# Patient Record
Sex: Female | Born: 1960 | Race: White | Hispanic: No | Smoking: Current every day smoker
Health system: Southern US, Community
[De-identification: ages and names within clinical notes are randomized; demographics above are authoritative.]

## PROBLEM LIST (undated history)

## (undated) DIAGNOSIS — Z8619 Personal history of other infectious and parasitic diseases: Secondary | ICD-10-CM

## (undated) HISTORY — DX: Personal history of other infectious and parasitic diseases: Z86.19

---

## 2008-07-03 DIAGNOSIS — Z720 Tobacco use: Secondary | ICD-10-CM | POA: Insufficient documentation

## 2008-07-03 DIAGNOSIS — Z8249 Family history of ischemic heart disease and other diseases of the circulatory system: Secondary | ICD-10-CM | POA: Insufficient documentation

## 2010-07-22 DIAGNOSIS — Z205 Contact with and (suspected) exposure to viral hepatitis: Secondary | ICD-10-CM | POA: Insufficient documentation

## 2010-07-23 ENCOUNTER — Ambulatory Visit: Payer: Self-pay | Admitting: Family Medicine

## 2010-08-01 ENCOUNTER — Ambulatory Visit: Payer: Self-pay | Admitting: Family Medicine

## 2010-08-18 ENCOUNTER — Ambulatory Visit: Payer: Self-pay | Admitting: Family Medicine

## 2010-08-21 HISTORY — PX: DILATION AND CURETTAGE OF UTERUS: SHX78

## 2010-09-11 ENCOUNTER — Ambulatory Visit: Payer: Self-pay | Admitting: Obstetrics and Gynecology

## 2010-09-16 ENCOUNTER — Ambulatory Visit: Payer: Self-pay | Admitting: Obstetrics and Gynecology

## 2010-09-19 LAB — PATHOLOGY REPORT

## 2011-05-04 ENCOUNTER — Ambulatory Visit: Payer: Self-pay | Admitting: Family Medicine

## 2011-08-25 ENCOUNTER — Emergency Department: Payer: Self-pay | Admitting: Internal Medicine

## 2011-08-25 HISTORY — PX: OTHER SURGICAL HISTORY: SHX169

## 2011-09-18 ENCOUNTER — Emergency Department: Payer: Self-pay | Admitting: Emergency Medicine

## 2011-09-18 ENCOUNTER — Ambulatory Visit: Payer: Self-pay | Admitting: Family Medicine

## 2011-10-28 ENCOUNTER — Ambulatory Visit: Payer: Self-pay | Admitting: Obstetrics and Gynecology

## 2011-10-28 LAB — BASIC METABOLIC PANEL
Calcium, Total: 8.8 mg/dL (ref 8.5–10.1)
Chloride: 107 mmol/L (ref 98–107)
Co2: 28 mmol/L (ref 21–32)
Osmolality: 278 (ref 275–301)
Potassium: 4.2 mmol/L (ref 3.5–5.1)
Sodium: 140 mmol/L (ref 136–145)

## 2011-10-28 LAB — WBC: WBC: 5.6 10*3/uL (ref 3.6–11.0)

## 2011-10-28 LAB — PREGNANCY, URINE: Pregnancy Test, Urine: NEGATIVE m[IU]/mL

## 2011-11-06 ENCOUNTER — Ambulatory Visit: Payer: Self-pay | Admitting: Obstetrics and Gynecology

## 2011-11-06 HISTORY — PX: TOTAL LAPAROSCOPIC HYSTERECTOMY WITH SALPINGECTOMY: SHX6742

## 2011-11-06 LAB — HEMOGLOBIN: HGB: 13.2 g/dL (ref 12.0–16.0)

## 2011-11-06 LAB — PREGNANCY, URINE: Pregnancy Test, Urine: NEGATIVE m[IU]/mL

## 2011-11-07 LAB — BASIC METABOLIC PANEL
BUN: 5 mg/dL — ABNORMAL LOW (ref 7–18)
Calcium, Total: 7.9 mg/dL — ABNORMAL LOW (ref 8.5–10.1)
Chloride: 106 mmol/L (ref 98–107)
Creatinine: 0.69 mg/dL (ref 0.60–1.30)
EGFR (African American): >60
EGFR (Non-African Amer.): >60
Osmolality: 280 (ref 275–301)
Sodium: 142 mmol/L (ref 136–145)

## 2012-08-12 ENCOUNTER — Ambulatory Visit: Payer: Self-pay | Admitting: Gastroenterology

## 2012-08-12 LAB — HM COLONOSCOPY: HM Colonoscopy: NORMAL

## 2012-08-23 ENCOUNTER — Ambulatory Visit: Payer: Self-pay | Admitting: Family Medicine

## 2012-08-24 ENCOUNTER — Ambulatory Visit: Payer: Self-pay | Admitting: Family Medicine

## 2012-08-31 ENCOUNTER — Ambulatory Visit: Payer: Self-pay | Admitting: Family Medicine

## 2012-08-31 HISTORY — PX: OTHER SURGICAL HISTORY: SHX169

## 2012-12-17 ENCOUNTER — Ambulatory Visit: Payer: Self-pay | Admitting: Family Medicine

## 2012-12-17 HISTORY — PX: OTHER SURGICAL HISTORY: SHX169

## 2014-02-01 LAB — LIPID PANEL
Cholesterol: 177 mg/dL (ref 0–200)
HDL: 60 mg/dL (ref 35–70)
LDL Cholesterol: 106 mg/dL
TRIGLYCERIDES: 57 mg/dL (ref 40–160)

## 2014-07-24 LAB — HEPATIC FUNCTION PANEL
ALT: 14 U/L (ref 7–35)
AST: 18 U/L (ref 13–35)

## 2014-07-24 LAB — BASIC METABOLIC PANEL
BUN: 10 mg/dL (ref 4–21)
CREATININE: 0.8 mg/dL (ref 0.5–1.1)
Glucose: 88 mg/dL
POTASSIUM: 4.4 mmol/L (ref 3.4–5.3)
SODIUM: 142 mmol/L (ref 137–147)

## 2014-07-24 LAB — CBC AND DIFFERENTIAL
HCT: 44 % (ref 36–46)
HEMOGLOBIN: 15.5 g/dL (ref 12.0–16.0)
Platelets: 229 10*3/uL (ref 150–399)
WBC: 6.4 10^3/mL

## 2014-07-24 LAB — TSH: TSH: 1.68 u[IU]/mL (ref 0.41–5.90)

## 2015-01-13 NOTE — Op Note (Signed)
PATIENT NAME:  Kristen Wright, Kristen Wright MR#:  [id number] DATE OF BIRTH:  1961/07/02  DATE OF PROCEDURE:  11/06/2011  PREOPERATIVE DIAGNOSES:  1. Severe midline pelvic pain unresponsive to medical management.  2. Severe dysmenorrhea.   POSTOPERATIVE DIAGNOSES:  1. Severe midline pelvic pain unresponsive to medical management.  2. Severe dysmenorrhea.   PROCEDURE: Total laparoscopic hysterectomy, bilateral salpingectomy for uterus greater than 250 mg.   SURGEON: Elliot Gurneyarrie C. Madolin Twaddle, MD  ASSISTANT: Deloris PingPhilip J. Rosenow, M.D.   ESTIMATED BLOOD LOSS: Approximately 200 mL.   DESCRIPTION OF PROCEDURE: Patient was taken to Operating Room, placed in supine position. After adequate general endotracheal anesthesia was instilled, the patient was prepped and draped in the usual sterile fashion. Side-opening speculum was placed in the patient's vagina and the anterior lip of the cervix was grasped with Wright single-tooth tenaculum. The Hulka tenaculum was placed into the uterus and Foley catheter was placed and side-opening speculum was removed. The umbilicus was injected with Marcaine. Incision was made. Veress needle was placed. Hanging drop test, fluid instillation test, fluid aspiration test showed proper placement of the Veress needle. CO2 was then turned on and when tympany was heard around the liver CO2 was placed from low flow to high flow. The Veress needle was removed and the Xcel trocar was placed under direct visualization. Patient was placed in Trendelenburg. Very globular uterus was identified. Tubes were grasped and removed with the Ace Harmonic. Cautery was then used to come across the round ligament, the utero-ovarian ligament and Ace Harmonic was used to separate the uterus, tubes and round ligament from the sidewall. Serial bites taken down the sides of the uterus. Bladder flap was created. V-care had been placed in the vagina. Uterine arteries are identified bilaterally. These are cauterized with the  Kleppinger and cut with the Ace Harmonic. The Ace Harmonic was then used to cut across the V-care green cup. When the cervix was released the uterus was pulled through the vagina to the outside. Wright vaginal plug was placed and the abdomen was allowed to expand with CO2. The Endo Stitch was then used to have running locked Vicryl sutures across the vaginal cuff. There were Wright few gaps and the needle of the Endo Stitch popped off. The needle was found and cut off and removed from the abdomen. The V-lock was then used to come across the vagina and tie off the knots the old Vicryl suture. These were then cut at the base and the sutures were removed from the vagina. The pelvis was then irrigated with copious amounts of warm normal saline. Intercede was placed across the vaginal cuff. Bowels were allowed to fall back into the pelvis. CO2 was allowed to escape. UR-6 was used under direct visualization to place Wright suture under the umbilicus as well as the left lower quadrant. 4-0 Monocryl was then used to approximate the skin edges. Dermabond was placed. The vaginal packing was taken out of the patient's vagina. Clear urine was noted with blue dye as indigo carmine had been given to help identify the ureters and to be sure there was no entry into the bladder.   ____________________________ Elliot Gurneyarrie C. Flynn Lininger, MD cck:cms D: 11/08/2011 00:09:53 ET T: 11/08/2011 08:55:43 ET JOB#: [id number]  cc: Elliot Gurneyarrie C. Braedan Meuth, MD, <Dictator>  Elliot GurneyARRIE C Avyonna Wagoner MD ELECTRONICALLY SIGNED 11/22/2011 1:28

## 2015-03-26 ENCOUNTER — Other Ambulatory Visit: Payer: Self-pay | Admitting: Family Medicine

## 2015-03-26 DIAGNOSIS — G4700 Insomnia, unspecified: Secondary | ICD-10-CM

## 2015-03-26 NOTE — Telephone Encounter (Signed)
Ok to call in rx.  Thanks.  

## 2015-03-26 NOTE — Telephone Encounter (Signed)
Pharmacy request refill. [facility] [place]) Last office visit was 11/20/2014.

## 2015-03-27 NOTE — Telephone Encounter (Signed)
Prescription called in to pharmacy

## 2015-05-24 ENCOUNTER — Other Ambulatory Visit: Payer: Self-pay | Admitting: Family Medicine

## 2015-05-24 DIAGNOSIS — G4700 Insomnia, unspecified: Secondary | ICD-10-CM

## 2015-05-28 DIAGNOSIS — G4700 Insomnia, unspecified: Secondary | ICD-10-CM | POA: Insufficient documentation

## 2015-05-28 NOTE — Telephone Encounter (Signed)
Rx called in to pharmacy. 

## 2015-05-28 NOTE — Telephone Encounter (Signed)
Please call in clonazepam  

## 2015-05-28 NOTE — Telephone Encounter (Signed)
Last OV 11/2014   

## 2015-06-26 ENCOUNTER — Other Ambulatory Visit: Payer: Self-pay | Admitting: Family Medicine

## 2015-06-26 NOTE — Telephone Encounter (Signed)
Please call in alprazolam.  Also, patient is due for office visit and needs to schedule before we can approve addition refills.

## 2015-06-26 NOTE — Telephone Encounter (Signed)
Rx called into pharmacy. Patient notified that she is due for ov appt.

## 2015-07-03 ENCOUNTER — Ambulatory Visit (INDEPENDENT_AMBULATORY_CARE_PROVIDER_SITE_OTHER): Payer: Federal, State, Local not specified - PPO | Admitting: Family Medicine

## 2015-07-03 ENCOUNTER — Encounter: Payer: Self-pay | Admitting: Family Medicine

## 2015-07-03 VITALS — BP 116/78 | HR 60 | Temp 97.7°F | Resp 16 | Wt 129.0 lb

## 2015-07-03 DIAGNOSIS — G4700 Insomnia, unspecified: Secondary | ICD-10-CM | POA: Insufficient documentation

## 2015-07-03 DIAGNOSIS — F439 Reaction to severe stress, unspecified: Secondary | ICD-10-CM | POA: Insufficient documentation

## 2015-07-03 DIAGNOSIS — D509 Iron deficiency anemia, unspecified: Secondary | ICD-10-CM | POA: Insufficient documentation

## 2015-07-03 DIAGNOSIS — M5126 Other intervertebral disc displacement, lumbar region: Secondary | ICD-10-CM | POA: Insufficient documentation

## 2015-07-03 DIAGNOSIS — M7550 Bursitis of unspecified shoulder: Secondary | ICD-10-CM | POA: Insufficient documentation

## 2015-07-03 DIAGNOSIS — F419 Anxiety disorder, unspecified: Secondary | ICD-10-CM | POA: Diagnosis not present

## 2015-07-03 DIAGNOSIS — Z23 Encounter for immunization: Secondary | ICD-10-CM | POA: Diagnosis not present

## 2015-07-03 DIAGNOSIS — N920 Excessive and frequent menstruation with regular cycle: Secondary | ICD-10-CM | POA: Insufficient documentation

## 2015-07-03 DIAGNOSIS — R9389 Abnormal findings on diagnostic imaging of other specified body structures: Secondary | ICD-10-CM | POA: Insufficient documentation

## 2015-07-03 DIAGNOSIS — N281 Cyst of kidney, acquired: Secondary | ICD-10-CM | POA: Insufficient documentation

## 2015-07-03 DIAGNOSIS — G2581 Restless legs syndrome: Secondary | ICD-10-CM | POA: Insufficient documentation

## 2015-07-03 DIAGNOSIS — E041 Nontoxic single thyroid nodule: Secondary | ICD-10-CM | POA: Insufficient documentation

## 2015-07-03 DIAGNOSIS — E7800 Pure hypercholesterolemia, unspecified: Secondary | ICD-10-CM | POA: Diagnosis not present

## 2015-07-03 DIAGNOSIS — F32A Depression, unspecified: Secondary | ICD-10-CM

## 2015-07-03 DIAGNOSIS — F329 Major depressive disorder, single episode, unspecified: Secondary | ICD-10-CM

## 2015-07-03 MED ORDER — BUPROPION HCL ER (SR) 100 MG PO TB12: 100.0000 mg | ORAL_TABLET | Freq: Two times a day (BID) | ORAL | Status: DC

## 2015-07-03 NOTE — Progress Notes (Signed)
Patient: Kristen HouseholderBecky A Wright Female    DOB: 1960/12/06   54 y.o.   MRN: [id number] Visit Date: 07/03/2015  Today's Provider: Mila Merryonald Jeanie Mccard, MD   Chief Complaint  Patient presents with  . Medication Refill  . Anxiety   Subjective:    HPI  Insomnia Started clonazepam 0.5 mg 1 tablet in the evening at last visit in March, and continues on trazodone 150 Qhs. She states she is sleeping much better since starting clonazepam. She was also having RLS symptoms at that time which seem to have resolve on clonazepam  Follow up cholesterol She states she is taking lovastatin consistently and is tolerating well.  Lab Results  Component Value Date   CHOL 177 02/01/2014   Lab Results  Component Value Date   HDL 60 02/01/2014   Lab Results  Component Value Date   LDLCALC 106 02/01/2014   Lab Results  Component Value Date   TRIG 57 02/01/2014   No results found for: CHOLHDL No results found for: LDLDIRECT  Depression She states she stills stays depressed, but is better when she is busy. She has been able to talk with friends and some resources through work which has been helpful. However, she has very poor motivation when at home by herself and never feels like doing anything. She is interested in making some changes to her antidepressants. She is tolerating escitalopram well, but not sure how much it is really helping.   No Known Allergies Previous Medications   ALPRAZOLAM (XANAX) 0.5 MG TABLET    take 1 tablet by mouth every 6 hours   CLONAZEPAM (KLONOPIN) 0.5 MG TABLET    take 1 tablet by mouth at bedtime   ESCITALOPRAM (LEXAPRO) 20 MG TABLET    Take 1 tablet by mouth daily.   LOVASTATIN (MEVACOR) 20 MG TABLET    Take 1 tablet by mouth daily.   TRAZODONE (DESYREL) 150 MG TABLET    Take 1 tablet by mouth every evening.    Review of Systems  Respiratory: Negative for chest tightness and shortness of breath.   Cardiovascular: Negative for chest pain and palpitations.    Neurological: Positive for dizziness and light-headedness. Negative for headaches.  Psychiatric/Behavioral: Positive for sleep disturbance.    Social History  Substance Use Topics  . Smoking status: Current Every Day Smoker -- 1.00 packs/day for 30 years    Types: Cigarettes  . Smokeless tobacco: Not on file  . Alcohol Use: No   Objective:   BP 116/78 mmHg  Pulse 60  Temp(Src) 97.7 F (36.5 C) (Oral)  Resp 16  Wt 129 lb (58.514 kg)  Physical Exam General appearance: alert, well developed, well nourished, cooperative and in no distress Head: Normocephalic, without obvious abnormality, atraumatic Lungs: Respirations even and unlabored Extremities: No gross deformities Skin: Skin color, texture, turgor normal. No rashes seen  Psych: Appropriate mood and affect. Neurologic: Mental status: Alert, oriented to person, place, and time, thought content appropriate.      Assessment & Plan:     1. Need for prophylactic vaccination and inoculation against influenza  - Flu Vaccine QUAD 36+ mos IM  2. Pure hypercholesterolemia She is tolerating lovastatin well with no adverse effects.   - TSH - Lipid panel  3. Anxiety Doing fairly well.   4. Insomnia Much better with addition of clonazepam  5. Depression Not much better. Continue escitalopram and buproprion. Call if any adverse effects.  - buPROPion (WELLBUTRIN SR) 100 MG 12  hr tablet; Take 1 tablet (100 mg total) by mouth 2 (two) times daily. Once daily for 7 days, then one tablet twice daily.  Dispense: 60 tablet; Refill: 1  Follow up about 6 weeks       Mila Merry, MD  [facility]

## 2015-07-04 ENCOUNTER — Encounter: Payer: Self-pay | Admitting: Family Medicine

## 2015-07-27 LAB — LIPID PANEL
CHOLESTEROL TOTAL: 164 mg/dL (ref 100–199)
Chol/HDL Ratio: 2.7 ratio units (ref 0.0–4.4)
HDL: 61 mg/dL (ref >39)
LDL Calculated: 90 mg/dL (ref 0–99)
Triglycerides: 65 mg/dL (ref 0–149)
VLDL CHOLESTEROL CAL: 13 mg/dL (ref 5–40)

## 2015-07-27 LAB — TSH: TSH: 0.698 u[IU]/mL (ref 0.450–4.500)

## 2015-08-14 ENCOUNTER — Ambulatory Visit (INDEPENDENT_AMBULATORY_CARE_PROVIDER_SITE_OTHER): Payer: Federal, State, Local not specified - PPO | Admitting: Family Medicine

## 2015-08-14 ENCOUNTER — Encounter: Payer: Self-pay | Admitting: Family Medicine

## 2015-08-14 VITALS — BP 110/74 | HR 57 | Temp 97.4°F | Resp 16 | Wt 132.4 lb

## 2015-08-14 DIAGNOSIS — F419 Anxiety disorder, unspecified: Secondary | ICD-10-CM | POA: Diagnosis not present

## 2015-08-14 DIAGNOSIS — F32A Depression, unspecified: Secondary | ICD-10-CM

## 2015-08-14 DIAGNOSIS — F329 Major depressive disorder, single episode, unspecified: Secondary | ICD-10-CM

## 2015-08-14 MED ORDER — ALPRAZOLAM 0.5 MG PO TABS: 0.5000 mg | ORAL_TABLET | Freq: Four times a day (QID) | ORAL | Status: DC | PRN

## 2015-08-14 MED ORDER — BUPROPION HCL ER (SR) 100 MG PO TB12: 100.0000 mg | ORAL_TABLET | Freq: Two times a day (BID) | ORAL | Status: DC

## 2015-08-14 NOTE — Progress Notes (Signed)
Subjective:     Patient ID: Kristen HouseholderBecky A Wolfinger, female   DOB: 06-Jul-1961, 54 y.o.   MRN: [id number]  HPI  Depression, Follow-up  She  was last seen for this 1 months ago. Changes made at last visit include adding bupropion 100mg  twice a day.   She reports good compliance with treatment. She is not having side effects.   She reports good tolerance of treatment.  She feels she is Improved since last visit. However the holidays are usually harder on her.   ------------------------------------------------------------------------     Review of Systems  Constitutional: Negative for fever, chills, diaphoresis and unexpected weight change.  HENT: Negative.   Respiratory: Negative.   Cardiovascular: Negative.   Endocrine: Negative.   Genitourinary: Negative.   Hematological: Negative.        Objective:   Physical Exam  Blood pressure 110/74, pulse 57, temperature 97.4 F (36.3 C), temperature source Oral, resp. rate 16, weight 132 lb 6.4 oz (60.056 kg), SpO2 95 %. General appearance: alert, well developed, well nourished, cooperative and in no distress Head: Normocephalic, without obvious abnormality, atraumatic Lungs: Respirations even and unlabored Extremities: No gross deformities Skin: Skin color, texture, turgor normal. No rashes seen  Psych: Appropriate mood and affect. Neurologic: Mental status: Alert, oriented to person, place, and time, thought content appropriate.      Assessment/Plan     1. Anxiety   2. Depression A bit improved since adding bupropion. Gave her option of continue current medications or increase bupropion dose. She thinks she is doing fine and will do better once we get through the holidays. Advised if to call if she feels she wants higher dose.

## 2015-08-21 ENCOUNTER — Other Ambulatory Visit: Payer: Self-pay | Admitting: Family Medicine

## 2015-09-13 ENCOUNTER — Other Ambulatory Visit: Payer: Self-pay | Admitting: Family Medicine

## 2015-09-23 ENCOUNTER — Other Ambulatory Visit: Payer: Self-pay | Admitting: Family Medicine

## 2015-09-24 NOTE — Telephone Encounter (Signed)
Please call in clonazepam  

## 2015-09-24 NOTE — Telephone Encounter (Signed)
Rx called in to pharmacy. 

## 2015-09-25 ENCOUNTER — Ambulatory Visit (INDEPENDENT_AMBULATORY_CARE_PROVIDER_SITE_OTHER): Payer: Federal, State, Local not specified - PPO | Admitting: Physician Assistant

## 2015-09-25 ENCOUNTER — Encounter: Payer: Self-pay | Admitting: Physician Assistant

## 2015-09-25 ENCOUNTER — Ambulatory Visit
Admission: RE | Admit: 2015-09-25 | Discharge: 2015-09-25 | Disposition: A | Discharge status: Home / Self Care | Payer: Federal, State, Local not specified - PPO | Source: Ambulatory Visit | Attending: Physician Assistant | Admitting: Physician Assistant

## 2015-09-25 VITALS — BP 100/70 | HR 68 | Temp 98.1°F | Resp 16 | Wt 126.0 lb

## 2015-09-25 DIAGNOSIS — M7989 Other specified soft tissue disorders: Secondary | ICD-10-CM | POA: Diagnosis not present

## 2015-09-25 DIAGNOSIS — M79641 Pain in right hand: Secondary | ICD-10-CM

## 2015-09-25 DIAGNOSIS — F32A Depression, unspecified: Secondary | ICD-10-CM

## 2015-09-25 DIAGNOSIS — F329 Major depressive disorder, single episode, unspecified: Secondary | ICD-10-CM | POA: Diagnosis not present

## 2015-09-25 DIAGNOSIS — R102 Pelvic and perineal pain: Secondary | ICD-10-CM

## 2015-09-25 LAB — POCT URINALYSIS DIPSTICK
Bilirubin, UA: NEGATIVE
GLUCOSE UA: NEGATIVE
Ketones, UA: NEGATIVE
LEUKOCYTES UA: NEGATIVE
NITRITE UA: NEGATIVE
Protein, UA: NEGATIVE
Spec Grav, UA: <=1.005
UROBILINOGEN UA: 0.2
pH, UA: 6

## 2015-09-25 MED ORDER — BUPROPION HCL ER (SR) 150 MG PO TB12: 150.0000 mg | ORAL_TABLET | Freq: Two times a day (BID) | ORAL | Status: DC

## 2015-09-25 NOTE — Patient Instructions (Signed)
Complicated Grieving Grief is a normal response to the death of someone close to you. Feelings of fear, anger, and guilt can affect almost everyone who loses a loved one. It is also common to have symptoms of depression while you are grieving. These include problems with sleep, loss of appetite, and lack of energy. They may last for weeks or months after a loss. Complicated grief is different from normal grief or depression. Normal grieving involves sadness and feelings of loss, but these feelings are not constant. Complicated grief is a constant and severe type of grief. It interferes with your ability to function normally. It may last for several months to a year or longer. Complicated grief may require treatment from a mental health care provider. CAUSES  It is not known why some people continue to struggle with grief and others do not. You may be at higher risk for complicated grief if:  The death of your loved one was sudden or unexpected.  The death of your loved one was due to a violent event.  Your loved one committed suicide.  Your loved one was a child or a young person.  You were very close to or dependent on the loved one.  You have a history of depression. SIGNS AND SYMPTOMS Signs and symptoms of complicated grief may include:  Feeling disbelief or numbness.  Being unable to enjoy good memories of your loved one.  Needing to avoid anything that reminds you of your loved one.  Being unable to stop thinking about the death.  Feeling intense anger or guilt.  Feeling alone and hopeless.  Feeling that your life is meaningless and empty.  Losing the desire to live. DIAGNOSIS Your health care provider may diagnose complicated grief if:  You have constant symptoms of grief for 6-12 months or longer.  Your symptoms are interfering with your ability to live your life. Your health care provider may want you to see a mental health care provider. Many symptoms of depression  are similar to the symptoms of complicated grief. It is important to be evaluated for complicated grief along with other mental health conditions. TREATMENT  Talk therapy with a mental health provider is the most common treatment for complicated grief. During therapy, you will learn healthy ways to cope with the loss of your loved one. In some cases, your mental health care provider may also recommend antidepressant medicines. HOME CARE INSTRUCTIONS  Take care of yourself.  Eat regular meals and maintain a healthy diet. Eat plenty of fruits, vegetables, and whole grains.  Try to get some exercise each day.  Keep regular hours for sleep. Try to get at least 8 hours of sleep each night.  Do not use drugs or alcohol to ease your symptoms.  Take medicines only as directed by your health care provider.  Spend time with friends and loved ones.  Consider joining a grief (bereavement) support group to help you deal with your loss.  Keep all follow-up visits as directed by your health care provider. This is important. SEEK MEDICAL CARE IF:  Your symptoms keep you from functioning normally.  Your symptoms do not get better with treatment. SEEK IMMEDIATE MEDICAL CARE IF:  You have serious thoughts of hurting yourself or someone else.  You have suicidal feelings.   This information is not intended to replace advice given to you by your health care provider. Make sure you discuss any questions you have with your health care provider.   Document Released: 09/07/2005   Document Revised: 05/29/2015 Document Reviewed: 02/15/2014 Elsevier Interactive Patient Education 2016 Elsevier Inc.  

## 2015-09-25 NOTE — Progress Notes (Signed)
Patient: Kristen Wright Female    DOB: [birth year]   55 y.o.   MRN: [id number] Visit Date: 09/25/2015  Today's Provider: Margaretann Loveless, PA-C   Chief Complaint  Patient presents with  . Abdominal Pain  . Edema    on fingers  . Depression   Subjective:    Abdominal Pain This is a new problem. The current episode started in the past 7 days. The onset quality is sudden. The problem occurs every several days. The pain is located in the LLQ, left flank and right flank. The pain is at a severity of 6/10. The pain is moderate. The quality of the pain is sharp. Associated symptoms include nausea. Pertinent negatives include no constipation, diarrhea, dysuria, fever, frequency, headaches, hematuria or vomiting. Nothing aggravates the pain. The pain is relieved by nothing. She has tried nothing for the symptoms. The treatment provided no relief.   Swelling on Fingers: Patient states her fingers are swelling from her right hand. (Middle Finger and Ring finger). Per patient had an injury (finger shut in a  truck door) a year ago and knows that it takes time to heal. But patient noticed her fingers more swollen about a week ago and is making it hard for her to do her work.  Depression: Patient is tearing in office about her depression symptoms that have gotent worse since Friday. Patient is currently on Bupropion, lexapro clonazepam and Xanax. Patient feeling more tired and fatigue. Doesn't want to get out of bed. She knew the holidays were going to be bad but states she didn't realize they were going to bother her this much. She has been dealing with this over the last 4 years since the death of her husband. She states this year she was alone and didn't have any of her family with her for New Year's and that caused her to have worsening depression symptoms. She feels that she is constantly taking once at 42 steps back with her grieving process and she did not realize that it would take her so  long to have only improved very slightly. Patient states Dr. Sherrie Mustache usually follows her but doesn't think she has a appointment with him. Per patient feeling agitation, nervous and anxious. Denies suicidal thoughts.  No Known Allergies Previous Medications   ALPRAZOLAM (XANAX) 0.5 MG TABLET    Take 1 tablet (0.5 mg total) by mouth every 6 (six) hours as needed.   BUPROPION (WELLBUTRIN SR) 100 MG 12 HR TABLET    Take 1 tablet (100 mg total) by mouth 2 (two) times daily.   CLONAZEPAM (KLONOPIN) 0.5 MG TABLET    take 1 tablet by mouth at bedtime   ESCITALOPRAM (LEXAPRO) 20 MG TABLET    take 1 tablet by mouth once daily   LOVASTATIN (MEVACOR) 20 MG TABLET    take 1 tablet by mouth once daily   TRAZODONE (DESYREL) 150 MG TABLET    Take 1 tablet by mouth every evening.    Review of Systems  Constitutional: Positive for activity change (Doesn't want to get out of bed. Since Friday.) and fatigue. Negative for fever.  Respiratory: Negative.   Cardiovascular: Negative.   Gastrointestinal: Positive for nausea and abdominal pain. Negative for vomiting, diarrhea and constipation.  Genitourinary: Negative for dysuria, frequency and hematuria.  Neurological: Positive for dizziness. Negative for headaches.  Psychiatric/Behavioral: Positive for sleep disturbance, dysphoric mood, decreased concentration and agitation. Negative for suicidal ideas. The patient is nervous/anxious.  Social History  Substance Use Topics  . Smoking status: Current Every Day Smoker -- 1.00 packs/day for 30 years    Types: Cigarettes  . Smokeless tobacco: Not on file  . Alcohol Use: No   Objective:   BP 100/70 mmHg  Pulse 68  Temp(Src) 98.1 F (36.7 C) (Oral)  Resp 16  Wt 126 lb (57.153 kg)  Physical Exam  Constitutional: She appears well-developed and well-nourished. No distress.  Neck: Normal range of motion. Neck supple. No JVD present. No tracheal deviation present. No thyromegaly present.  Cardiovascular:  Normal rate, regular rhythm and normal heart sounds.  Exam reveals no gallop and no friction rub.   No murmur heard. Pulmonary/Chest: Effort normal and breath sounds normal. No respiratory distress. She has no wheezes. She has no rales.  Abdominal: Soft. Bowel sounds are normal. She exhibits no distension and no mass. There is no hepatosplenomegaly. There is tenderness in the right lower quadrant and left lower quadrant. There is no rebound, no guarding and no CVA tenderness.  Musculoskeletal:  Right 2nd-3rd fingers with swelling over the PIP joints and limited flexion. Decreased grip strength.  Lymphadenopathy:    She has no cervical adenopathy.  Skin: She is not diaphoretic.  Psychiatric: Her speech is normal and behavior is normal. Judgment and thought content normal. Cognition and memory are normal. She exhibits a depressed mood.  Vitals reviewed.       Assessment & Plan:     1. Pelvic pain in female UA was negative for UTI.  She has had a hysterectomy but her ovaries remain and she is concerned the pain is secondary to either scar tissue or something with her ovaries such as cyst.  No mass was palpated.  I will get an US to R/O ovarian causes for the abdominal pain.  I will f/u pending the results.  She is to call the office in the meantime if symptoms worsen. - POCT Urinalysis Dipstick - US Pelvis Complete; Future - US Transvaginal Non-OB; Future  2. Right hand pain Will get xray to r/o an abnormal healing of an old fracture from the initial injury since there has not been an xray.  Also may possibly be mild arthritis vs trigger finger.  Depending on xray results will proceed with anti-inflammatories vs orthopedic referral. - DG Hand Complete Right; Future  3. Depression Worsening. Will increase the wellbutrin as below.  She is to call the office if symptoms worsen.  She is to follow up with myself or Dr. Sherrie MustacheFisher in 4-8 weeks. - buPROPion (WELLBUTRIN SR) 150 MG 12 hr tablet; Take 1  tablet (150 mg total) by mouth 2 (two) times daily.  Dispense: 60 tablet; Refill: 0       Margaretann LovelessJennifer M Burnette, PA-C  [facility]

## 2015-09-26 ENCOUNTER — Telehealth: Payer: Self-pay | Admitting: Physician Assistant

## 2015-09-26 NOTE — Telephone Encounter (Signed)
Kristen Wright Jenni, Mallory at [facility] called wanted to know about the reason patient is getting the ultrasound.  Per Antony ContrasJenni sorry but is Right and left lower quadrant pain. Making sure patient doesn't have an ovarian Cyst. Mallory Informed.  Thanks,  -Joseline

## 2015-09-26 NOTE — Telephone Encounter (Signed)
Kristen Wright with [facility] has a question about the order tomorrow for this pt.  ZO#[id number]/[id number]#[phone]/MW

## 2015-09-27 ENCOUNTER — Telehealth: Payer: Self-pay

## 2015-09-27 ENCOUNTER — Ambulatory Visit
Admission: RE | Admit: 2015-09-27 | Discharge: 2015-09-27 | Disposition: A | Discharge status: Home / Self Care | Payer: Federal, State, Local not specified - PPO | Source: Ambulatory Visit | Attending: Physician Assistant | Admitting: Physician Assistant

## 2015-09-27 DIAGNOSIS — N83201 Unspecified ovarian cyst, right side: Secondary | ICD-10-CM | POA: Insufficient documentation

## 2015-09-27 DIAGNOSIS — R1031 Right lower quadrant pain: Secondary | ICD-10-CM

## 2015-09-27 DIAGNOSIS — R102 Pelvic and perineal pain: Secondary | ICD-10-CM | POA: Diagnosis present

## 2015-09-27 DIAGNOSIS — R1032 Left lower quadrant pain: Secondary | ICD-10-CM

## 2015-09-27 DIAGNOSIS — Z90710 Acquired absence of both cervix and uterus: Secondary | ICD-10-CM | POA: Insufficient documentation

## 2015-09-27 NOTE — Telephone Encounter (Signed)
-----   Message from Margaretann LovelessJennifer M Burnette, [place]PA-C sent at 09/27/2015  3:06 PM EST ----- Normal pelvic and transvaginal ultrasound.

## 2015-09-27 NOTE — Telephone Encounter (Signed)
Patient advised as directed. Per patient the pain is worst after the transvaginal ultrasound. Per patient already taking Ibuprofen. Patient wants to know what is causing the pain. No new symptoms since the office visit.  Thanks,  -Ashleynicole Mcclees

## 2015-10-01 NOTE — Telephone Encounter (Signed)
Patient advised as directed below. Patient wants the referral to GI  Thanks,  -Joseline

## 2015-10-01 NOTE — Telephone Encounter (Signed)
I can refer her to GI if she would like to see if they can offer any other options as well as possibly get a colonoscopy to r/o anything with the colon.

## 2015-10-01 NOTE — Telephone Encounter (Signed)
Referral has been made to GI

## 2015-10-03 ENCOUNTER — Encounter: Payer: Self-pay | Admitting: Gastroenterology

## 2015-10-28 ENCOUNTER — Other Ambulatory Visit: Payer: Self-pay | Admitting: Physician Assistant

## 2015-10-28 DIAGNOSIS — F32A Depression, unspecified: Secondary | ICD-10-CM

## 2015-10-28 DIAGNOSIS — F329 Major depressive disorder, single episode, unspecified: Secondary | ICD-10-CM

## 2015-10-28 MED ORDER — BUPROPION HCL ER (SR) 150 MG PO TB12: 150.0000 mg | ORAL_TABLET | Freq: Two times a day (BID) | ORAL | Status: DC

## 2015-10-28 NOTE — Progress Notes (Signed)
Wellbutrin refilled and sent to [facility].

## 2015-10-28 NOTE — Progress Notes (Signed)
Advised patient as below.  

## 2015-11-06 ENCOUNTER — Ambulatory Visit: Payer: Federal, State, Local not specified - PPO | Admitting: Gastroenterology

## 2016-01-20 ENCOUNTER — Other Ambulatory Visit: Payer: Self-pay | Admitting: Family Medicine

## 2016-01-21 NOTE — Telephone Encounter (Signed)
Please call in alprazolam and clonazepam

## 2016-01-21 NOTE — Telephone Encounter (Signed)
Rx called in to pharmacy. 

## 2016-02-19 ENCOUNTER — Other Ambulatory Visit: Payer: Self-pay | Admitting: Family Medicine

## 2016-03-13 ENCOUNTER — Other Ambulatory Visit: Payer: Self-pay | Admitting: Physician Assistant

## 2016-03-13 DIAGNOSIS — F329 Major depressive disorder, single episode, unspecified: Secondary | ICD-10-CM

## 2016-03-13 DIAGNOSIS — F32A Depression, unspecified: Secondary | ICD-10-CM

## 2016-03-13 DIAGNOSIS — Z205 Contact with and (suspected) exposure to viral hepatitis: Secondary | ICD-10-CM

## 2016-03-13 MED ORDER — BUPROPION HCL ER (SR) 150 MG PO TB12: 150.0000 mg | ORAL_TABLET | Freq: Two times a day (BID) | ORAL | Status: DC

## 2016-05-26 ENCOUNTER — Other Ambulatory Visit: Payer: Self-pay | Admitting: Family Medicine

## 2016-05-26 DIAGNOSIS — F32A Depression, unspecified: Secondary | ICD-10-CM

## 2016-05-26 DIAGNOSIS — F329 Major depressive disorder, single episode, unspecified: Secondary | ICD-10-CM

## 2016-05-26 NOTE — Telephone Encounter (Signed)
Last ov 06/24/16

## 2016-05-26 NOTE — Telephone Encounter (Signed)
Rx called in to pharmacy. 

## 2016-06-27 ENCOUNTER — Other Ambulatory Visit: Payer: Self-pay | Admitting: Family Medicine

## 2016-06-27 DIAGNOSIS — F32A Depression, unspecified: Secondary | ICD-10-CM

## 2016-06-27 DIAGNOSIS — F329 Major depressive disorder, single episode, unspecified: Secondary | ICD-10-CM

## 2016-06-30 ENCOUNTER — Other Ambulatory Visit: Payer: Self-pay | Admitting: *Deleted

## 2016-06-30 MED ORDER — ALPRAZOLAM 0.5 MG PO TABS: ORAL_TABLET | ORAL | 5 refills | Status: DC

## 2016-06-30 MED ORDER — CLONAZEPAM 0.5 MG PO TABS: 0.5000 mg | ORAL_TABLET | Freq: Every day | ORAL | 5 refills | Status: DC

## 2016-06-30 NOTE — Telephone Encounter (Signed)
Please call in alprazolam and clonazepam

## 2016-07-01 NOTE — Telephone Encounter (Signed)
Rx called in to pharmacy. 

## 2016-07-13 ENCOUNTER — Other Ambulatory Visit: Payer: Self-pay | Admitting: Family Medicine

## 2016-07-21 ENCOUNTER — Encounter: Payer: Self-pay | Admitting: Family Medicine

## 2016-07-21 ENCOUNTER — Ambulatory Visit (INDEPENDENT_AMBULATORY_CARE_PROVIDER_SITE_OTHER): Payer: Federal, State, Local not specified - PPO | Admitting: Family Medicine

## 2016-07-21 VITALS — BP 120/80 | HR 76 | Temp 97.6°F | Resp 18 | Wt 130.0 lb

## 2016-07-21 DIAGNOSIS — R05 Cough: Secondary | ICD-10-CM | POA: Diagnosis not present

## 2016-07-21 DIAGNOSIS — J0100 Acute maxillary sinusitis, unspecified: Secondary | ICD-10-CM | POA: Diagnosis not present

## 2016-07-21 DIAGNOSIS — R059 Cough, unspecified: Secondary | ICD-10-CM

## 2016-07-21 LAB — POCT INFLUENZA A/B
INFLUENZA A, POC: NEGATIVE
INFLUENZA B, POC: NEGATIVE

## 2016-07-21 MED ORDER — AMOXICILLIN 500 MG PO CAPS: 1000.0000 mg | ORAL_CAPSULE | Freq: Two times a day (BID) | ORAL | 0 refills | Status: AC

## 2016-07-21 NOTE — Progress Notes (Signed)
Patient: Kristen Wright Female    DOB: 1960/11/09   55 y.o.   MRN: [id number] Visit Date: 07/21/2016  Today's Provider: Mila Merryonald Fisher, MD   Chief Complaint  Patient presents with  . Cough   Subjective:    Patient has had cough and congestion for 4 days. Patient has symptoms of sore throat, chest congestion, productive cough, headaches, and nasal congestion. Patient has been taking otc dayquil with mild relief. Patient also has symptoms of muscle ache and fatigue. No fever.   Cough  This is a new problem. The current episode started in the past 7 days (4 days). The problem has been gradually improving. The cough is productive of sputum. Associated symptoms include headaches, myalgias, nasal congestion, postnasal drip, rhinorrhea and a sore throat. Pertinent negatives include no chest pain, chills, ear congestion, ear pain, fever, heartburn, rash, shortness of breath, sweats, weight loss or wheezing. Nothing aggravates the symptoms. She has tried rest (Dayquil) for the symptoms. The treatment provided mild relief. Her past medical history is significant for pneumonia. There is no history of asthma, bronchiectasis, bronchitis, COPD, emphysema or environmental allergies.  Sore throat started after coughing for a few days.     No Known Allergies   Current Outpatient Prescriptions:  .  ALPRAZolam (XANAX) 0.5 MG tablet, take 1 tablet by mouth every 6 hours if needed, Disp: 30 tablet, Rfl: 5 .  buPROPion (WELLBUTRIN SR) 150 MG 12 hr tablet, Take 1 tablet (150 mg total) by mouth 2 (two) times daily., Disp: 60 tablet, Rfl: 6 .  clonazePAM (KLONOPIN) 0.5 MG tablet, take 1 tablet by mouth at bedtime, Disp: 30 tablet, Rfl: 5 .  escitalopram (LEXAPRO) 20 MG tablet, take 1 tablet by mouth once daily, Disp: 30 tablet, Rfl: 11 .  lovastatin (MEVACOR) 20 MG tablet, take 1 tablet by mouth once daily, Disp: 30 tablet, Rfl: 5 .  traZODone (DESYREL) 150 MG tablet, take 1 tablet by mouth every  evening, Disp: 30 tablet, Rfl: 11  Review of Systems  Constitutional: Negative for appetite change, chills, fatigue, fever and weight loss.  HENT: Positive for congestion, postnasal drip, rhinorrhea, sneezing and sore throat. Negative for ear pain and sinus pressure.   Respiratory: Positive for cough. Negative for chest tightness, shortness of breath and wheezing.   Cardiovascular: Negative for chest pain and palpitations.  Gastrointestinal: Negative for abdominal pain, heartburn, nausea and vomiting.  Musculoskeletal: Positive for myalgias.  Skin: Negative for rash.  Allergic/Immunologic: Negative for environmental allergies.  Neurological: Positive for headaches. Negative for dizziness and weakness.    Social History  Substance Use Topics  . Smoking status: Current Every Day Smoker    Packs/day: 1.00    Years: 30.00    Types: Cigarettes  . Smokeless tobacco: Not on file  . Alcohol use No   Objective:   BP 120/80 (BP Location: Right Arm, Patient Position: Sitting, Cuff Size: Normal)   Pulse 76   Temp 97.6 F (36.4 C) (Oral)   Resp 18   Wt 130 lb (59 kg)  Sa02=99% room air  Physical Exam  General Appearance:    Alert, cooperative, no distress  HENT:   bilateral TM normal without fluid or infection, neck without nodes, throat normal without erythema or exudate, Maxillary sinuses tender, post nasal drip noted and nasal mucosa congested  Eyes:    PERRL, conjunctiva/corneas clear, EOM's intact       Lungs:     Clear to auscultation bilaterally, respirations  unlabored  Heart:    Regular rate and rhythm  Neurologic:   Awake, alert, oriented x 3. No apparent focal neurological           defect.       Results for orders placed or performed in visit on 07/21/16  POCT Influenza A/B  Result Value Ref Range   Influenza A, POC Negative Negative   Influenza B, POC Negative Negative       Assessment & Plan:     1. Cough  - POCT Influenza A/B  2. Acute maxillary sinusitis,  recurrence not specified  - amoxicillin (AMOXIL) 500 MG capsule; Take 2 capsules (1,000 mg total) by mouth 2 (two) times daily.  Dispense: 40 capsule; Refill: 0  Call if symptoms change or if not rapidly improving.          Mila Merryonald Fisher, MD  [name]

## 2016-09-02 ENCOUNTER — Other Ambulatory Visit: Payer: Self-pay | Admitting: Family Medicine

## 2016-09-28 ENCOUNTER — Ambulatory Visit (INDEPENDENT_AMBULATORY_CARE_PROVIDER_SITE_OTHER): Payer: Federal, State, Local not specified - PPO | Admitting: Family Medicine

## 2016-09-28 ENCOUNTER — Encounter: Payer: Self-pay | Admitting: Family Medicine

## 2016-09-28 VITALS — BP 118/82 | HR 67 | Temp 98.2°F | Resp 16 | Wt 129.8 lb

## 2016-09-28 DIAGNOSIS — R5381 Other malaise: Secondary | ICD-10-CM | POA: Diagnosis not present

## 2016-09-28 DIAGNOSIS — R5383 Other fatigue: Secondary | ICD-10-CM

## 2016-09-28 DIAGNOSIS — D509 Iron deficiency anemia, unspecified: Secondary | ICD-10-CM

## 2016-09-28 DIAGNOSIS — J069 Acute upper respiratory infection, unspecified: Secondary | ICD-10-CM | POA: Diagnosis not present

## 2016-09-28 MED ORDER — AMOXICILLIN 875 MG PO TABS: 875.0000 mg | ORAL_TABLET | Freq: Two times a day (BID) | ORAL | 0 refills | Status: DC

## 2016-09-28 NOTE — Progress Notes (Signed)
Patient: Kristen HouseholderBecky A Hattery Female    DOB: July 17, 1961   56 y.o.   MRN: [id number] Visit Date: 09/28/2016  Today's Provider: Dortha Kernennis Dodd Schmid, PA   Chief Complaint  Patient presents with  . URI   Subjective:    URI   This is a new problem. The current episode started yesterday. The problem has been unchanged. There has been no fever. Associated symptoms include congestion, coughing and rhinorrhea. Associated symptoms comments: Patient also has symptoms of muscle ache and fatigue.  Patient reports being diagnosed with URI on 07/21/2016 and states symptoms are the same.   Past Medical History:  Diagnosis Date  . History of chicken pox   . History of mumps    Patient Active Problem List   Diagnosis Date Noted  . Abnormal chest xray 07/03/2015  . Anxiety 07/03/2015  . Lumbar herniated disc 07/03/2015  . Microcytic anemia 07/03/2015  . Pure hypercholesterolemia 07/03/2015  . Renal cyst, right 07/03/2015  . Restless leg syndrome 07/03/2015  . Situational stress 07/03/2015  . Thyroid nodule 07/03/2015  . Depression 07/03/2015  . Insomnia 05/28/2015  . Exposure to hepatitis C 07/22/2010  . Fam hx-ischem heart disease 07/03/2008  . Tobacco use 07/03/2008   Past Surgical History:  Procedure Laterality Date  . ABDOMINAL HYSTERECTOMY  11/06/2011   Uterine mass  . CT Scan of Abdomen/ Pelvis  08/25/2011   Without Contrast- Normal  . DILATION AND CURETTAGE OF UTERUS  08/2010   due to excessive bleeding  . MRI Abdomen  08/31/2012   Findings consistent with small cyst in right kidney. Other small densities in both kidneys likely representing cysts  . MRI Lumbar spine  12/17/2012   Possible disc fragment compressing T12 and L1 nerve root   Family History  Problem Relation Age of Onset  . Heart attack Father   . Hyperlipidemia Brother   . CAD Other   . Heart attack Other    No Known Allergies   Previous Medications   ALPRAZOLAM (XANAX) 0.5 MG TABLET    take 1 tablet by mouth  every 6 hours if needed   BUPROPION (WELLBUTRIN SR) 150 MG 12 HR TABLET    Take 1 tablet (150 mg total) by mouth 2 (two) times daily.   CLONAZEPAM (KLONOPIN) 0.5 MG TABLET    take 1 tablet by mouth at bedtime   ESCITALOPRAM (LEXAPRO) 20 MG TABLET    take 1 tablet by mouth once daily   LOVASTATIN (MEVACOR) 20 MG TABLET    take 1 tablet by mouth once daily   TRAZODONE (DESYREL) 150 MG TABLET    take 1 tablet by mouth every evening    Review of Systems  Constitutional: Positive for fatigue.  HENT: Positive for congestion and rhinorrhea.   Respiratory: Positive for cough.   Cardiovascular: Negative.   Musculoskeletal: Positive for myalgias.    Social History  Substance Use Topics  . Smoking status: Current Every Day Smoker    Packs/day: 1.00    Years: 30.00    Types: Cigarettes  . Smokeless tobacco: Not on file  . Alcohol use No   Objective:   BP 118/82 (BP Location: Right Arm, Patient Position: Sitting, Cuff Size: Normal)   Pulse 67   Temp 98.2 F (36.8 C) (Oral)   Resp 16   Wt 129 lb 12.8 oz (58.9 kg)   SpO2 95%   Physical Exam  Constitutional: She is oriented to person, place, and time.  Thin individual.  HENT:  Head:  Normocephalic.  Right Ear: External ear normal.  Left Ear: External ear normal.  Mouth/Throat: Oropharynx is clear and moist.  Clear mucus drainage.  Eyes: Conjunctivae are normal.  Neck: Neck supple.  Cardiovascular: Normal rate and regular rhythm.   Pulmonary/Chest: Effort normal and breath sounds normal.  Abdominal: Soft. Bowel sounds are normal.  Lymphadenopathy:    She has no cervical adenopathy.  Neurological: She is alert and oriented to person, place, and time.  Psychiatric: Her speech is normal. Her mood appears anxious. She is slowed. She exhibits a depressed mood. She expresses no suicidal plans and no homicidal plans.      Assessment & Plan:      1. Upper respiratory tract infection, unspecified type Onset of rhinorrhea, stuffy head,  cough and poor transillumination of sinuses. Similar to sinus infection 07-21-16. Will treat with antibiotic and may continue Dayquil with Tylenol or Advil prn aches and pains. Increase fluid intake and check routine labs. Follow up as needed. Give not for out of work today due to illness and office appointment. - CBC with Differential/Platelet - amoxicillin (AMOXIL) 875 MG tablet; Take 1 tablet (875 mg total) by mouth 2 (two) times daily.  Dispense: 20 tablet; Refill: 0  2. Microcytic anemia History of anemia with fatigue. Not taking any vitamin or iron supplements. - CBC with Differential/Platelet - Comprehensive metabolic panel - TSH  3. Malaise and fatigue Onset over the past 24 hours with URI symptoms. Has a history of depression due to bereavement (husband died a year ago). Will check routine labs for metabolic disorder versus infection/viral illness. Has a history of hypothyroidism with a "thyroid nodule" (no masses felt today). - CBC with Differential/Platelet - Comprehensive metabolic panel - TSH

## 2016-09-29 ENCOUNTER — Telehealth: Payer: Self-pay

## 2016-09-29 LAB — CBC WITH DIFFERENTIAL/PLATELET
BASOS ABS: 0 10*3/uL (ref 0.0–0.2)
Basos: 0 %
EOS (ABSOLUTE): 0.1 10*3/uL (ref 0.0–0.4)
Eos: 2 %
HEMATOCRIT: 38.8 % (ref 34.0–46.6)
Hemoglobin: 12.9 g/dL (ref 11.1–15.9)
Immature Grans (Abs): 0 10*3/uL (ref 0.0–0.1)
Immature Granulocytes: 0 %
LYMPHS ABS: 1.8 10*3/uL (ref 0.7–3.1)
Lymphs: 30 %
MCH: 28.9 pg (ref 26.6–33.0)
MCHC: 33.2 g/dL (ref 31.5–35.7)
MCV: 87 fL (ref 79–97)
Monocytes Absolute: 0.5 10*3/uL (ref 0.1–0.9)
Monocytes: 8 %
NEUTROS ABS: 3.7 10*3/uL (ref 1.4–7.0)
Neutrophils: 60 %
Platelets: 240 10*3/uL (ref 150–379)
RBC: 4.47 x10E6/uL (ref 3.77–5.28)
RDW: 13.4 % (ref 12.3–15.4)
WBC: 6.1 10*3/uL (ref 3.4–10.8)

## 2016-09-29 LAB — COMPREHENSIVE METABOLIC PANEL
ALBUMIN: 4.2 g/dL (ref 3.5–5.5)
ALK PHOS: 76 IU/L (ref 39–117)
ALT: 9 IU/L (ref 0–32)
AST: 11 IU/L (ref 0–40)
Albumin/Globulin Ratio: 1.8 (ref 1.2–2.2)
BILIRUBIN TOTAL: 0.4 mg/dL (ref 0.0–1.2)
BUN / CREAT RATIO: 8 — AB (ref 9–23)
BUN: 7 mg/dL (ref 6–24)
CHLORIDE: 100 mmol/L (ref 96–106)
CO2: 27 mmol/L (ref 18–29)
CREATININE: 0.88 mg/dL (ref 0.57–1.00)
Calcium: 9.1 mg/dL (ref 8.7–10.2)
GFR calc Af Amer: 87 mL/min/{1.73_m2} (ref >59)
GFR calc non Af Amer: 75 mL/min/{1.73_m2} (ref >59)
GLOBULIN, TOTAL: 2.4 g/dL (ref 1.5–4.5)
Glucose: 78 mg/dL (ref 65–99)
POTASSIUM: 4 mmol/L (ref 3.5–5.2)
SODIUM: 141 mmol/L (ref 134–144)
Total Protein: 6.6 g/dL (ref 6.0–8.5)

## 2016-09-29 LAB — TSH: TSH: 1.17 u[IU]/mL (ref 0.450–4.500)

## 2016-09-29 NOTE — Telephone Encounter (Signed)
-----   Message from Jodell Ciproennis E Cayucoshrismon, [place] sent at 09/29/2016  8:37 AM EST ----- All blood tests normal. No anemia or sign of bacterial infection. Normal thyroid, kidney and liver function tests. No sign of abnormal blood sugar problems. Probably has a viral upper respiratory illness now. Proceed with treatment as discussed in the office and recheck if no better in 10-14 days.

## 2016-09-29 NOTE — Telephone Encounter (Signed)
Advised pt of lab results. Pt verbally acknowledges understanding. Bunnie Lederman Drozdowski, CMA   

## 2016-11-11 ENCOUNTER — Other Ambulatory Visit: Payer: Self-pay | Admitting: Physician Assistant

## 2016-11-11 DIAGNOSIS — F32A Depression, unspecified: Secondary | ICD-10-CM

## 2016-11-11 DIAGNOSIS — F329 Major depressive disorder, single episode, unspecified: Secondary | ICD-10-CM

## 2016-11-24 ENCOUNTER — Other Ambulatory Visit: Payer: Self-pay | Admitting: Family Medicine

## 2016-11-24 NOTE — Telephone Encounter (Signed)
Please call in alprazolam.  

## 2016-11-25 NOTE — Telephone Encounter (Signed)
RX called in at [facility] pharmacy  

## 2016-12-01 ENCOUNTER — Telehealth: Payer: Self-pay

## 2016-12-01 NOTE — Telephone Encounter (Signed)
Patient called requesting that the triage nurse call be faxed to her employer due to her not able to be seen in the office (since we were closed due to the weather). Patient reports that she left work due to having diarrhea, and she needed proof that she did talk to a nurse. Call-a-nurse report was faxed to [phone] in attention to [facility].

## 2016-12-30 ENCOUNTER — Other Ambulatory Visit: Payer: Self-pay | Admitting: Family Medicine

## 2016-12-30 DIAGNOSIS — F329 Major depressive disorder, single episode, unspecified: Secondary | ICD-10-CM

## 2016-12-30 DIAGNOSIS — F32A Depression, unspecified: Secondary | ICD-10-CM

## 2016-12-30 NOTE — Telephone Encounter (Signed)
Please call in clonazepam  

## 2016-12-31 NOTE — Telephone Encounter (Signed)
Rx called in to pharmacy. 

## 2017-01-12 ENCOUNTER — Other Ambulatory Visit: Payer: Self-pay | Admitting: Family Medicine

## 2017-02-22 ENCOUNTER — Encounter: Payer: Self-pay | Admitting: Family Medicine

## 2017-02-22 ENCOUNTER — Ambulatory Visit (INDEPENDENT_AMBULATORY_CARE_PROVIDER_SITE_OTHER): Payer: Federal, State, Local not specified - PPO | Admitting: Family Medicine

## 2017-02-22 DIAGNOSIS — M5126 Other intervertebral disc displacement, lumbar region: Secondary | ICD-10-CM | POA: Diagnosis not present

## 2017-02-22 DIAGNOSIS — M5430 Sciatica, unspecified side: Secondary | ICD-10-CM

## 2017-02-22 DIAGNOSIS — M549 Dorsalgia, unspecified: Secondary | ICD-10-CM

## 2017-02-22 MED ORDER — PREDNISONE 10 MG PO TABS: ORAL_TABLET | ORAL | 0 refills | Status: AC

## 2017-02-22 MED ORDER — HYDROCODONE-ACETAMINOPHEN 7.5-325 MG PO TABS: 1.0000 | ORAL_TABLET | Freq: Four times a day (QID) | ORAL | 0 refills | Status: DC | PRN

## 2017-02-22 NOTE — Progress Notes (Signed)
Patient: Kristen Wright Female    DOB: [birth year]   56 y.o.   MRN: [id number] Visit Date: 02/22/2017  Today's Provider: Mila Merry, MD   Chief Complaint  Patient presents with  . Back Pain   Subjective:    Patient is having recurrent lower back pain. Patient stated that her lower back started hurting 2 days ago. Patient stated that she is in a lot of pain and unable to bend down. Pain radiates to left buttock. Patient has taken otc advil and pain patch with no relief.    Back Pain  This is a recurrent problem. The current episode started in the past 7 days (2 days ago). The problem occurs constantly. The problem is unchanged. The pain is present in the lumbar spine. Radiates to: radiates to left buttock and up towards mid back. The pain is at a severity of 10/10. The pain is severe. The pain is the same all the time. The symptoms are aggravated by bending, twisting, position and lying down (lifting). Associated symptoms include weakness. Pertinent negatives include no abdominal pain, bladder incontinence, bowel incontinence, chest pain, dysuria, fever, headaches, leg pain, numbness, paresis, paresthesias, pelvic pain, perianal numbness, tingling or weight loss. Treatments tried: otc pain patch and advil. The treatment provided no relief.  She has history of lumbar disk herniation on MRI in 2014, but has has exacerbation for the last two days.    No Known Allergies   Current Outpatient Prescriptions:  .  ALPRAZolam (XANAX) 0.5 MG tablet, take 1 tablet by mouth every 6 hours if needed, Disp: 30 tablet, Rfl: 5 .  buPROPion (WELLBUTRIN SR) 150 MG 12 hr tablet, take 1 tablet by mouth twice a day, Disp: 60 tablet, Rfl: 12 .  clonazePAM (KLONOPIN) 0.5 MG tablet, take 1 tablet by mouth at bedtime, Disp: 30 tablet, Rfl: 5 .  escitalopram (LEXAPRO) 20 MG tablet, take 1 tablet by mouth once daily, Disp: 30 tablet, Rfl: 12 .  lovastatin (MEVACOR) 20 MG tablet, take 1 tablet by mouth once  daily, Disp: 30 tablet, Rfl: 6 .  traZODone (DESYREL) 150 MG tablet, take 1 tablet by mouth every evening, Disp: 30 tablet, Rfl: 11  Review of Systems  Constitutional: Negative for appetite change, chills, fatigue, fever and weight loss.  Respiratory: Negative for chest tightness and shortness of breath.   Cardiovascular: Negative for chest pain and palpitations.  Gastrointestinal: Negative for abdominal pain, bowel incontinence, nausea and vomiting.  Genitourinary: Negative for bladder incontinence, dysuria and pelvic pain.  Musculoskeletal: Positive for back pain and myalgias.  Neurological: Positive for weakness. Negative for dizziness, tingling, numbness, headaches and paresthesias.    Social History  Substance Use Topics  . Smoking status: Current Every Day Smoker    Packs/day: 1.00    Years: 30.00    Types: Cigarettes  . Smokeless tobacco: Never Used  . Alcohol use No   Objective:   BP 94/70 (BP Location: Right Arm, Patient Position: Sitting, Cuff Size: Normal)   Pulse 77   Temp 98 F (36.7 C) (Oral)   Resp 16   Wt 130 lb (59 kg)   SpO2 95%  Vitals:   02/22/17 1503  BP: 94/70  Pulse: 77  Resp: 16  Temp: 98 F (36.7 C)  TempSrc: Oral  SpO2: 95%  Weight: 130 lb (59 kg)     Physical Exam  General appearance: alert, well developed, well nourished, cooperative and in no distress Head: Normocephalic, without obvious  abnormality, atraumatic Respiratory: Respirations even and unlabored, normal respiratory rate Extremities: No gross deformities MS: Moderate tenderness lumbar spine and para lumbar muscles.  Neurologic: Mental status: Alert, oriented to person, place, and time, thought content appropriate.     Assessment & Plan:     1. Back pain with sciatica  - HYDROcodone-acetaminophen (NORCO) 7.5-325 MG tablet; Take 1 tablet by mouth every 6 (six) hours as needed for moderate pain.  Dispense: 20 tablet; Refill: 0  2. Lumbar herniated disc  - predniSONE  (DELTASONE) 10 MG tablet; 6 tablets for 2 days, then 5 for 2 days, then 4 for 2 days, then 3 for 2 days, then 2 for 2 days, then 1 for 2 days.  Dispense: 42 tablet; Refill: 0  Call if symptoms change or if not rapidly improving.          Mila Merryonald Emmet Messer, MD  [facility]

## 2017-03-03 ENCOUNTER — Other Ambulatory Visit: Payer: Self-pay | Admitting: Family Medicine

## 2017-06-03 ENCOUNTER — Other Ambulatory Visit: Payer: Self-pay | Admitting: Family Medicine

## 2017-06-06 NOTE — Telephone Encounter (Signed)
Please call in alprazolam.  

## 2017-06-07 NOTE — Telephone Encounter (Signed)
Rx called in to pharmacy. 

## 2017-06-28 ENCOUNTER — Other Ambulatory Visit: Payer: Self-pay | Admitting: Family Medicine

## 2017-06-28 DIAGNOSIS — F32A Depression, unspecified: Secondary | ICD-10-CM

## 2017-06-28 DIAGNOSIS — F329 Major depressive disorder, single episode, unspecified: Secondary | ICD-10-CM

## 2017-06-29 NOTE — Telephone Encounter (Signed)
Rx called in to pharmacy. 

## 2017-06-29 NOTE — Telephone Encounter (Signed)
Please call in clonazepam  

## 2017-07-05 ENCOUNTER — Encounter: Payer: Self-pay | Admitting: Family Medicine

## 2017-07-05 ENCOUNTER — Ambulatory Visit (INDEPENDENT_AMBULATORY_CARE_PROVIDER_SITE_OTHER): Payer: Federal, State, Local not specified - PPO | Admitting: Family Medicine

## 2017-07-05 VITALS — BP 110/74 | HR 65 | Temp 97.9°F | Resp 16 | Wt 136.0 lb

## 2017-07-05 DIAGNOSIS — B349 Viral infection, unspecified: Secondary | ICD-10-CM | POA: Diagnosis not present

## 2017-07-05 MED ORDER — PROMETHAZINE-DM 6.25-15 MG/5ML PO SYRP: 5.0000 mL | ORAL_SOLUTION | Freq: Four times a day (QID) | ORAL | 0 refills | Status: DC | PRN

## 2017-07-05 NOTE — Progress Notes (Signed)
Subjective:     Patient ID: Kristen Wright, female   DOB: March 26, 1961, 56 y.o.   MRN: [id number]  HPI  Chief Complaint  Patient presents with  . Diarrhea    Patient comes in office today with complaints of diarrhea and nausea that began last night. Patient states that she has had cramping, headache, and weakness associated with diarrhea but has not taken any otc medication for relief.   . Cough    Patient complains of dry cough and runny nose upon wakening up this morning.   States her 34 month old grandson has a similar illness. She reports her daughter and grandson live with her. She has decreased smoking while ill.   Review of Systems     Objective:   Physical Exam  Constitutional: She appears well-developed and well-nourished. No distress.  Abdominal: Soft. There is tenderness (mild in bilateral lower quadrants). There is no guarding.  Ears: T.M's intact without inflammation Throat: no tonsillar enlargement or exudate Neck: no cervical adenopathy Lungs: clear     Assessment:    1. Viral syndrome - promethazine-dextromethorphan (PROMETHAZINE-DM) 6.25-15 MG/5ML syrup; Take 5 mLs by mouth 4 (four) times daily as needed for cough.  Dispense: 60 mL; Refill: 0    Plan:    Work excuse of 10/15-10/17/18. Discussed use of Gatorade, imodium and eating as tolerated. May use Mucinex D for congestion.

## 2017-07-05 NOTE — Patient Instructions (Addendum)
Discussed use of imodium for loose stools. Keep up with fluids with Gatorade and eat as tolerated. Try Mucinex D for congestion.

## 2017-07-21 ENCOUNTER — Other Ambulatory Visit: Payer: Self-pay | Admitting: Family Medicine

## 2017-08-17 ENCOUNTER — Other Ambulatory Visit: Payer: Self-pay | Admitting: Family Medicine

## 2017-09-11 ENCOUNTER — Other Ambulatory Visit: Payer: Self-pay | Admitting: Family Medicine

## 2017-11-19 ENCOUNTER — Other Ambulatory Visit: Payer: Self-pay | Admitting: Family Medicine

## 2017-11-19 DIAGNOSIS — F329 Major depressive disorder, single episode, unspecified: Secondary | ICD-10-CM

## 2017-11-19 DIAGNOSIS — F32A Depression, unspecified: Secondary | ICD-10-CM

## 2017-11-29 ENCOUNTER — Other Ambulatory Visit: Payer: Self-pay | Admitting: Family Medicine

## 2017-11-29 DIAGNOSIS — F329 Major depressive disorder, single episode, unspecified: Secondary | ICD-10-CM

## 2017-11-29 DIAGNOSIS — F32A Depression, unspecified: Secondary | ICD-10-CM

## 2017-12-23 ENCOUNTER — Other Ambulatory Visit: Payer: Self-pay | Admitting: Family Medicine

## 2018-01-06 ENCOUNTER — Encounter: Payer: Self-pay | Admitting: Family Medicine

## 2018-01-06 ENCOUNTER — Ambulatory Visit: Payer: Federal, State, Local not specified - PPO | Admitting: Family Medicine

## 2018-01-06 VITALS — BP 132/84 | HR 67 | Temp 97.8°F | Resp 18 | Wt 131.0 lb

## 2018-01-06 DIAGNOSIS — F419 Anxiety disorder, unspecified: Secondary | ICD-10-CM | POA: Diagnosis not present

## 2018-01-06 DIAGNOSIS — G2581 Restless legs syndrome: Secondary | ICD-10-CM

## 2018-01-06 DIAGNOSIS — F5101 Primary insomnia: Secondary | ICD-10-CM

## 2018-01-06 DIAGNOSIS — M5126 Other intervertebral disc displacement, lumbar region: Secondary | ICD-10-CM | POA: Diagnosis not present

## 2018-01-06 DIAGNOSIS — F3289 Other specified depressive episodes: Secondary | ICD-10-CM | POA: Diagnosis not present

## 2018-01-06 MED ORDER — MIRTAZAPINE 15 MG PO TABS: 15.0000 mg | ORAL_TABLET | Freq: Every day | ORAL | 3 refills | Status: DC

## 2018-01-06 NOTE — Progress Notes (Signed)
Patient: Kristen Wright Female    DOB: 03-24-1961   57 y.o.   MRN: [id number] Visit Date: 01/06/2018  Today's Provider: Mila Merry, MD   Chief Complaint  Patient presents with  . Depression  . Back Pain   Subjective:    HPI  Back pain with sciatica From 02/22/2017-given rx for HYDROcodone-acetaminophen (NORCO) 7.5-325 MG tablet and prednisone 10 mg taper. Today patient comes in reporting the back pain is unchanged. Patient is out of pain medication.    Depression From 09/25/2015-seen by Antony Contras. Worsening. Increased the wellbutrin to buPROPion (WELLBUTRIN SR) 150 MG 12 hr tablet bid. Today patient comes in reporting that the Depression has worsened within the past few months. Patient says some days she doesn't want to get out of bed. She has restless sleep at night. Patient feels that her current medications are not helping to control symptoms. She has had frequent crying spells.    Anxiety From 1 year ago-current treatment clonazepam and alprazolam. Patient reports good compliance with treatment.  Takes alprazolam maybe once a day and clonazepam at bedtime  Reports that she does have a lot of stress in her home life aggravating her depression or anxiety. She does not have suicidal thoughts. She states she does talk to telephone counselor once or twice a month, but doesn't feel it helps much. She also doesn't think trazodone is helping with sleep any more. She states she falls asleep, but is very restless and wakes frequently. She does have occasions when she is either to tires or sleepy to go to work and to anxious and depressed to get out of the house. States she has been missing work once or twice a month due to symptoms.      No Known Allergies   Current Outpatient Medications:  .  ALPRAZolam (XANAX) 0.5 MG tablet, TAKE 1 TABLET BY MOUTH EVERY 6 HOURS AS NEEDED, Disp: 30 tablet, Rfl: 3 .  buPROPion (WELLBUTRIN SR) 150 MG 12 hr tablet, TAKE 1 TABLET BY MOUTH TWICE  DAILY, Disp: 60 tablet, Rfl: 4 .  clonazePAM (KLONOPIN) 0.5 MG tablet, TAKE 1 TABLET BY MOUTH EVERY NIGHT AT BEDTIME, Disp: 30 tablet, Rfl: 4 .  escitalopram (LEXAPRO) 20 MG tablet, TAKE 1 TABLET BY MOUTH ONCE DAILY, Disp: 30 tablet, Rfl: 5 .  HYDROcodone-acetaminophen (NORCO) 7.5-325 MG tablet, Take 1 tablet by mouth every 6 (six) hours as needed for moderate pain., Disp: 20 tablet, Rfl: 0 .  lovastatin (MEVACOR) 20 MG tablet, TAKE 1 TABLET BY MOUTH DAILY, Disp: 30 tablet, Rfl: 12 .  traZODone (DESYREL) 150 MG tablet, take 1 tablet by mouth every evening, Disp: 30 tablet, Rfl: 11  Review of Systems  Constitutional: Negative for appetite change, chills, fatigue and fever.  Respiratory: Negative for chest tightness and shortness of breath.   Cardiovascular: Negative for chest pain and palpitations.  Gastrointestinal: Negative for abdominal pain, nausea and vomiting.  Neurological: Positive for tremors. Negative for dizziness and weakness.  Psychiatric/Behavioral: Positive for dysphoric mood and sleep disturbance. The patient is nervous/anxious.        Frequent crying    Social History   Tobacco Use  . Smoking status: Current Every Day Smoker    Packs/day: 0.50    Years: 30.00    Pack years: 15.00    Types: Cigarettes  . Smokeless tobacco: Never Used  Substance Use Topics  . Alcohol use: Yes    Alcohol/week: 0.0 oz   Objective:   BP  132/84 (BP Location: Left Arm, Patient Position: Sitting, Cuff Size: Normal)   Pulse 67   Temp 97.8 F (36.6 C) (Oral)   Resp 18   Wt 131 lb (59.4 kg)   SpO2 95% Comment: room air There were no vitals filed for this visit.   Physical Exam  General appearance: alert, well developed, well nourished, cooperative and in no distress Head: Normocephalic, without obvious abnormality, atraumatic Respiratory: Respirations even and unlabored, normal respiratory rate Extremities: No gross deformities Skin: Skin color, texture, turgor normal. No rashes  seen  Psych: Appropriate mood and affect. Neurologic: Mental status: Alert, oriented to person, place, and time, thought content appropriate.     Assessment & Plan:     1. Anxiety Worsening, continue escitalopram and prn alprazolam.   2. Restless leg syndrome Currently on clonazepam which will continue   3. Primary insomnia trazodone not very effective, will change to - mirtazapine (REMERON) 15 MG tablet; Take 1 tablet (15 mg total) by mouth at bedtime.  Dispense: 30 tablet; Refill: 3  4. Other depression Worsening. Discussed formally referring to psychologist. She is currently talking to counselor on telephone on occasional. Will reconsider referral at follow up.  - mirtazapine (REMERON) 15 MG tablet; Take 1 tablet (15 mg total) by mouth at bedtime.  Dispense: 30 tablet; Refill: 3  5. Lumbar herniated disc Stable on prn hydrocodone/apap   Reviewed and completed FMLA forms while patient in office today.        Mila Merryonald Fisher, MD  [facility]

## 2018-02-04 ENCOUNTER — Ambulatory Visit: Payer: Federal, State, Local not specified - PPO | Admitting: Family Medicine

## 2018-02-04 ENCOUNTER — Encounter: Payer: Self-pay | Admitting: Family Medicine

## 2018-02-04 VITALS — BP 124/80 | HR 56 | Temp 97.5°F | Resp 16 | Ht 65.0 in | Wt 129.0 lb

## 2018-02-04 DIAGNOSIS — F5101 Primary insomnia: Secondary | ICD-10-CM

## 2018-02-04 DIAGNOSIS — F3289 Other specified depressive episodes: Secondary | ICD-10-CM | POA: Diagnosis not present

## 2018-02-04 NOTE — Patient Instructions (Addendum)
   Let me know if you more problems with nightmares, in which case we can change dose of clonazepam   Try OTC Tumeric for arthritis pains.

## 2018-02-04 NOTE — Progress Notes (Signed)
Patient: Kristen Wright Female    DOB: Jul 20, 1961   57 y.o.   MRN: [id number] Visit Date: 02/04/2018  Today's Provider: Mila Merry, MD   Chief Complaint  Patient presents with  . Follow-up  . Depression  . Insomnia   Subjective:    HPI  Primary insomnia From 01/06/2018-changed trazodone to mirtazapine (REMERON) 15 MG tablet. She states she was having frequent nightmares when she started medication, but those have mostly resolved and she is sleeping much better now.   Other depression From 01/06/2018-Worsening. Discussed formally referring to psychologist. She is currently talking to counselor on telephone on occasional.Started mirtazapine (REMERON) 15 MG tablet. She states that her mood is much improved, especially in the mornings, and she feels more motivated and no having crying spells in the mornings like she was.   Wt Readings from Last 3 Encounters:  02/04/18 129 lb (58.5 kg)  01/06/18 131 lb (59.4 kg)  07/05/17 136 lb (61.7 kg)     Lumbar herniated disc From 01/06/2018-no changes. Stable on prn hydrocodone/apap.   No Known Allergies   Current Outpatient Medications:  .  ALPRAZolam (XANAX) 0.5 MG tablet, TAKE 1 TABLET BY MOUTH EVERY 6 HOURS AS NEEDED, Disp: 30 tablet, Rfl: 3 .  buPROPion (WELLBUTRIN SR) 150 MG 12 hr tablet, TAKE 1 TABLET BY MOUTH TWICE DAILY, Disp: 60 tablet, Rfl: 4 .  clonazePAM (KLONOPIN) 0.5 MG tablet, TAKE 1 TABLET BY MOUTH EVERY NIGHT AT BEDTIME, Disp: 30 tablet, Rfl: 4 .  escitalopram (LEXAPRO) 20 MG tablet, TAKE 1 TABLET BY MOUTH ONCE DAILY, Disp: 30 tablet, Rfl: 5 .  lovastatin (MEVACOR) 20 MG tablet, TAKE 1 TABLET BY MOUTH DAILY, Disp: 30 tablet, Rfl: 12 .  mirtazapine (REMERON) 15 MG tablet, Take 1 tablet (15 mg total) by mouth at bedtime., Disp: 30 tablet, Rfl: 3  Review of Systems  Constitutional: Negative for appetite change, chills, fatigue and fever.  Respiratory: Negative for chest tightness and shortness of breath.     Cardiovascular: Negative for chest pain and palpitations.  Gastrointestinal: Negative for abdominal pain, nausea and vomiting.  Neurological: Negative for dizziness and weakness.    Social History   Tobacco Use  . Smoking status: Current Every Day Smoker    Packs/day: 0.50    Years: 30.00    Pack years: 15.00    Types: Cigarettes  . Smokeless tobacco: Never Used  Substance Use Topics  . Alcohol use: Yes    Alcohol/week: 0.0 oz   Objective:   BP 124/80 (BP Location: Right Arm, Patient Position: Sitting, Cuff Size: Normal)   Pulse (!) 56   Temp (!) 97.5 F (36.4 C) (Oral)   Resp 16   Ht  (1.651 m)   Wt 129 lb (58.5 kg)   SpO2 99%   BMI 21.47 kg/m  Vitals:   02/04/18 1018  BP: 124/80  Pulse: (!) 56  Resp: 16  Temp: (!) 97.5 F (36.4 C)  TempSrc: Oral  SpO2: 99%  Weight: 129 lb (58.5 kg)  Height:  (1.651 m)     Physical Exam  General appearance: alert, well developed, well nourished, cooperative and in no distress Head: Normocephalic, without obvious abnormality, atraumatic Respiratory: Respirations even and unlabored, normal respiratory rate Extremities: No gross deformities Skin: Skin color, texture, turgor normal. No rashes seen  Psych: Appropriate mood and affect. Neurologic: Mental status: Alert, oriented to person, place, and time, thought content appropriate.  Assessment & Plan:     1. Other depression Improved with initiation so mirtazapine. Continue current medications.    2. Primary insomnia Improved since changing trazodone to mirtazapine. Continue current medications.    Return in about 4 months (around 06/07/2018).        Mila Merry, MD  [facility]

## 2018-02-19 ENCOUNTER — Other Ambulatory Visit: Payer: Self-pay | Admitting: Family Medicine

## 2018-03-14 ENCOUNTER — Other Ambulatory Visit: Payer: Self-pay | Admitting: Family Medicine

## 2018-04-11 ENCOUNTER — Other Ambulatory Visit: Payer: Self-pay | Admitting: Family Medicine

## 2018-05-10 ENCOUNTER — Other Ambulatory Visit: Payer: Self-pay | Admitting: Family Medicine

## 2018-05-10 DIAGNOSIS — F32A Depression, unspecified: Secondary | ICD-10-CM

## 2018-05-10 DIAGNOSIS — F329 Major depressive disorder, single episode, unspecified: Secondary | ICD-10-CM

## 2018-05-11 ENCOUNTER — Other Ambulatory Visit: Payer: Self-pay | Admitting: Family Medicine

## 2018-05-15 ENCOUNTER — Other Ambulatory Visit: Payer: Self-pay | Admitting: Family Medicine

## 2018-05-15 DIAGNOSIS — F329 Major depressive disorder, single episode, unspecified: Secondary | ICD-10-CM

## 2018-05-15 DIAGNOSIS — F32A Depression, unspecified: Secondary | ICD-10-CM

## 2018-06-10 ENCOUNTER — Encounter: Payer: Self-pay | Admitting: Family Medicine

## 2018-06-10 ENCOUNTER — Ambulatory Visit: Payer: Federal, State, Local not specified - PPO | Admitting: Family Medicine

## 2018-06-10 ENCOUNTER — Encounter: Payer: Self-pay | Admitting: *Deleted

## 2018-06-10 VITALS — BP 126/86 | HR 58 | Temp 97.6°F | Resp 16 | Wt 136.0 lb

## 2018-06-10 DIAGNOSIS — E785 Hyperlipidemia, unspecified: Secondary | ICD-10-CM

## 2018-06-10 DIAGNOSIS — F419 Anxiety disorder, unspecified: Secondary | ICD-10-CM | POA: Diagnosis not present

## 2018-06-10 DIAGNOSIS — F5101 Primary insomnia: Secondary | ICD-10-CM

## 2018-06-10 DIAGNOSIS — Z1159 Encounter for screening for other viral diseases: Secondary | ICD-10-CM | POA: Diagnosis not present

## 2018-06-10 DIAGNOSIS — Z23 Encounter for immunization: Secondary | ICD-10-CM

## 2018-06-10 NOTE — Progress Notes (Signed)
Patient: Kristen HouseholderBecky A Topor Female    DOB: 14-Oct-1960   57 y.o.   MRN: [id number] Visit Date: 06/10/2018  Today's Provider: Mila Merryonald Fisher, MD   Chief Complaint  Patient presents with  . Follow-up  . Depression  . Insomnia   Subjective:    HPI  Other depression 02/04/2018-no changes were made. She sontinues on escitalopram which she feels is helping and is not causing any adverse effects.    Primary insomnia From 02/04/2018. She had taken trazodone previously but she requested it be changed since it was making her groggy the next morning. She was prescribed mirtazapine which she has since discontinued due to having nightmares while taking it. She still takes alprazolam every day and clonazepam at bedtime, but is not sleeping through the night. She reports he brother is dying of cancer in [place] which is causing much more stress and anxiety.   No Known Allergies   Current Outpatient Medications:  .  ALPRAZolam (XANAX) 0.5 MG tablet, TAKE 1 TABLET BY MOUTH EVERY 6 HOURS AS NEEDED, Disp: 30 tablet, Rfl: 5 .  buPROPion (WELLBUTRIN SR) 150 MG 12 hr tablet, TAKE 1 TABLET BY MOUTH TWICE DAILY, Disp: 60 tablet, Rfl: 6 .  clonazePAM (KLONOPIN) 0.5 MG tablet, TAKE 1 TABLET BY MOUTH EVERY NIGHT AT BEDTIME, Disp: 30 tablet, Rfl: 3 .  escitalopram (LEXAPRO) 20 MG tablet, TAKE 1 TABLET BY MOUTH ONCE DAILY, Disp: 30 tablet, Rfl: 6 .  lovastatin (MEVACOR) 20 MG tablet, TAKE 1 TABLET BY MOUTH DAILY, Disp: 30 tablet, Rfl: 12 .  mirtazapine (REMERON) 15 MG tablet, Take 1 tablet (15 mg total) by mouth at bedtime. (Patient not taking: Reported on 06/10/2018), Disp: 30 tablet, Rfl: 3 .  traZODone (DESYREL) 150 MG tablet, TAKE 1 TABLET BY MOUTH EVERY EVENING (Patient not taking: Reported on 06/10/2018), Disp: 30 tablet, Rfl: 5  Review of Systems  Constitutional: Negative for appetite change, chills, fatigue and fever.  Respiratory: Negative for chest tightness and shortness of breath.     Cardiovascular: Negative for chest pain and palpitations.  Gastrointestinal: Negative for abdominal pain, nausea and vomiting.  Neurological: Negative for dizziness and weakness.    Social History   Tobacco Use  . Smoking status: Current Every Day Smoker    Packs/day: 0.50    Years: 30.00    Pack years: 15.00    Types: Cigarettes  . Smokeless tobacco: Never Used  Substance Use Topics  . Alcohol use: Yes    Alcohol/week: 0.0 standard drinks   Objective:   BP 126/86 (BP Location: Right Arm, Patient Position: Sitting, Cuff Size: Normal)   Pulse (!) 58   Temp 97.6 F (36.4 C) (Oral)   Resp 16   Wt 136 lb (61.7 kg)   SpO2 94%   BMI 22.63 kg/m    Physical Exam  .General appearance: alert, well developed, well nourished, cooperative and in no distress Head: Normocephalic, without obvious abnormality, atraumatic Respiratory: Respirations even and unlabored, normal respiratory rate Extremities: No gross deformities Skin: Skin color, texture, turgor normal. No rashes seen  Psych: Appropriate mood and affect. Neurologic: Mental status: Alert, oriented to person, place, and time, thought content appropriate.     Assessment & Plan:     1. Primary insomnia Did not tolerate mirtazapine. Id rather not add any sedatives since she is taking benzodiazepines, but she can try gong back on lower dose of trazodone. Will try taking 1/3 or 2/3 tablets of her 100mg  trazodone tables.  2. Anxiety Continue lexapro which has been effective and well tolerated.   3. Hyperlipidemia, unspecified hyperlipidemia type She is tolerating lovastatin well with no adverse effects.   - Comprehensive metabolic panel - Lipid panel  4. Need for hepatitis C screening test  - Hepatitis C antibody  5. Need for influenza vaccination  - Flu Vaccine QUAD 36+ mos IM  Counseled that she is due for mammogram and given contact information for [facility] .        Mila Merry, MD  [facility]

## 2018-06-10 NOTE — Patient Instructions (Addendum)
.   [facility] [phone]) to schedule a routine screening mammogram.   Try taking 1/3 or 2/3 tablets of trazodone at night instead of a whole tablet.

## 2018-06-11 LAB — LIPID PANEL
Chol/HDL Ratio: 2.5 ratio (ref 0.0–4.4)
Cholesterol, Total: 177 mg/dL (ref 100–199)
HDL: 71 mg/dL (ref >39)
LDL Calculated: 94 mg/dL (ref 0–99)
Triglycerides: 60 mg/dL (ref 0–149)
VLDL CHOLESTEROL CAL: 12 mg/dL (ref 5–40)

## 2018-06-11 LAB — COMPREHENSIVE METABOLIC PANEL
ALBUMIN: 4.7 g/dL (ref 3.5–5.5)
ALK PHOS: 95 IU/L (ref 39–117)
ALT: 13 IU/L (ref 0–32)
AST: 19 IU/L (ref 0–40)
Albumin/Globulin Ratio: 2.4 — ABNORMAL HIGH (ref 1.2–2.2)
BUN/Creatinine Ratio: 12 (ref 9–23)
BUN: 11 mg/dL (ref 6–24)
Bilirubin Total: 0.3 mg/dL (ref 0.0–1.2)
CHLORIDE: 100 mmol/L (ref 96–106)
CO2: 27 mmol/L (ref 20–29)
Calcium: 9.6 mg/dL (ref 8.7–10.2)
Creatinine, Ser: 0.89 mg/dL (ref 0.57–1.00)
GFR calc non Af Amer: 73 mL/min/{1.73_m2} (ref >59)
GFR, EST AFRICAN AMERICAN: 84 mL/min/{1.73_m2} (ref >59)
GLOBULIN, TOTAL: 2 g/dL (ref 1.5–4.5)
Glucose: 77 mg/dL (ref 65–99)
Potassium: 4.9 mmol/L (ref 3.5–5.2)
Sodium: 141 mmol/L (ref 134–144)
Total Protein: 6.7 g/dL (ref 6.0–8.5)

## 2018-06-11 LAB — HEPATITIS C ANTIBODY

## 2018-06-15 ENCOUNTER — Telehealth: Payer: Self-pay | Admitting: *Deleted

## 2018-06-15 NOTE — Telephone Encounter (Signed)
No answer and no vm. Will try again later.  

## 2018-06-15 NOTE — Telephone Encounter (Signed)
-----   Message from Malva Limesonald E Fisher, MD sent at 06/13/2018 12:33 PM EDT ----- Labs normal. Continue current medications.  Check yearly.

## 2018-06-16 NOTE — Telephone Encounter (Signed)
Patient advised and verbally voiced understanding.  

## 2018-06-28 ENCOUNTER — Ambulatory Visit: Payer: Federal, State, Local not specified - PPO | Admitting: Family Medicine

## 2018-06-28 ENCOUNTER — Encounter: Payer: Self-pay | Admitting: Family Medicine

## 2018-06-28 VITALS — BP 108/64 | HR 71 | Temp 97.8°F | Resp 16 | Wt 139.4 lb

## 2018-06-28 DIAGNOSIS — R319 Hematuria, unspecified: Secondary | ICD-10-CM

## 2018-06-28 DIAGNOSIS — M62830 Muscle spasm of back: Secondary | ICD-10-CM | POA: Diagnosis not present

## 2018-06-28 DIAGNOSIS — R300 Dysuria: Secondary | ICD-10-CM

## 2018-06-28 DIAGNOSIS — N390 Urinary tract infection, site not specified: Secondary | ICD-10-CM | POA: Diagnosis not present

## 2018-06-28 LAB — POCT URINALYSIS DIPSTICK
Bilirubin, UA: NEGATIVE
Glucose, UA: NEGATIVE
Ketones, UA: 5
NITRITE UA: NEGATIVE
PH UA: 5 (ref 5.0–8.0)
Protein, UA: POSITIVE — AB
Spec Grav, UA: >=1.03 — AB (ref 1.010–1.025)
UROBILINOGEN UA: 0.2 U/dL

## 2018-06-28 MED ORDER — CEPHALEXIN 500 MG PO CAPS: 500.0000 mg | ORAL_CAPSULE | Freq: Four times a day (QID) | ORAL | 0 refills | Status: AC

## 2018-06-28 MED ORDER — NAPROXEN 500 MG PO TABS: 500.0000 mg | ORAL_TABLET | Freq: Two times a day (BID) | ORAL | 0 refills | Status: DC

## 2018-06-28 MED ORDER — CYCLOBENZAPRINE HCL 5 MG PO TABS: 5.0000 mg | ORAL_TABLET | Freq: Three times a day (TID) | ORAL | 1 refills | Status: DC | PRN

## 2018-06-28 NOTE — Patient Instructions (Signed)
Acute Urinary Retention, Female Urinary retention means you are unable to pee completely or at all (empty your bladder). Follow these instructions at home:  Drink enough fluids to keep your pee (urine) clear or pale yellow.  If you are sent home with a tube that drains the bladder (catheter), there will be a drainage bag attached to it. There are two types of bags. One is big that you can wear at night without having to empty it. One is smaller and needs to be emptied more often.  Keep the drainage bag emptied.  Keep the drainage bag lower than the tube.  Only take medicine as told by your doctor. Contact a doctor if:  You have a low-grade fever.  You have spasms or you are leaking pee when you have spasms. Get help right away if:  You have chills or a fever.  Your catheter stops draining pee.  Your catheter falls out.  You have increased bleeding that does not stop after you have rested and increased the amount of fluids you had been drinking. This information is not intended to replace advice given to you by your health care provider. Make sure you discuss any questions you have with your health care provider. Document Released: 02/24/2008 Document Revised: 02/13/2016 Document Reviewed: 02/16/2013 Elsevier Interactive Patient Education  2017 Elsevier Inc.  

## 2018-06-28 NOTE — Progress Notes (Signed)
Patient: Kristen Wright Female    DOB: 1960-12-15   57 y.o.   MRN: [id number] Visit Date: 06/28/2018  Today's Provider: Mila Merry, MD   Chief Complaint  Patient presents with  . Dysuria  . Shoulder Pain  . Back Pain   Subjective:    Dysuria   This is a new problem. Episode onset: 1 week ago. The problem occurs every urination. The problem has been unchanged. The quality of the pain is described as burning. Associated symptoms include a discharge. Pertinent negatives include no frequency, hematuria, nausea, sweats, urgency or vomiting. She has tried nothing for the symptoms.  Shoulder Pain   The pain is present in the right shoulder. This is a new problem. The current episode started yesterday. There has been no history of extremity trauma. The problem occurs constantly. The quality of the pain is described as burning. Associated symptoms include a limited range of motion. Pertinent negatives include no joint locking, joint swelling, numbness or tingling. Exacerbated by: movement. She has tried nothing for the symptoms.  Back Pain  This is a new problem. The current episode started yesterday. The problem occurs constantly. The problem has been gradually worsening since onset. The pain is present in the lumbar spine. The quality of the pain is described as aching. Associated symptoms include dysuria. Pertinent negatives include no numbness or tingling. She has tried nothing for the symptoms.       Allergies  Allergen Reactions  . Mirtazapine     Nightmares     Current Outpatient Medications:  .  ALPRAZolam (XANAX) 0.5 MG tablet, TAKE 1 TABLET BY MOUTH EVERY 6 HOURS AS NEEDED, Disp: 30 tablet, Rfl: 5 .  buPROPion (WELLBUTRIN SR) 150 MG 12 hr tablet, TAKE 1 TABLET BY MOUTH TWICE DAILY, Disp: 60 tablet, Rfl: 6 .  clonazePAM (KLONOPIN) 0.5 MG tablet, TAKE 1 TABLET BY MOUTH EVERY NIGHT AT BEDTIME, Disp: 30 tablet, Rfl: 3 .  escitalopram (LEXAPRO) 20 MG tablet, TAKE 1 TABLET BY  MOUTH ONCE DAILY, Disp: 30 tablet, Rfl: 6 .  lovastatin (MEVACOR) 20 MG tablet, TAKE 1 TABLET BY MOUTH DAILY, Disp: 30 tablet, Rfl: 12 .  traZODone (DESYREL) 150 MG tablet, TAKE 1 TABLET BY MOUTH EVERY EVENING (Patient not taking: Reported on 06/28/2018), Disp: 30 tablet, Rfl: 5  Review of Systems  Constitutional: Negative.   Respiratory: Negative.   Cardiovascular: Negative.   Gastrointestinal: Negative for nausea and vomiting.  Genitourinary: Positive for dysuria. Negative for frequency, hematuria and urgency.  Musculoskeletal: Positive for back pain. Arthralgias: shoulder pain.  Neurological: Negative for tingling and numbness.    Social History   Tobacco Use  . Smoking status: Current Every Day Smoker    Packs/day: 0.50    Years: 30.00    Pack years: 15.00    Types: Cigarettes  . Smokeless tobacco: Never Used  Substance Use Topics  . Alcohol use: Yes    Alcohol/week: 0.0 standard drinks   Objective:   BP 108/64 (BP Location: Right Arm, Patient Position: Sitting, Cuff Size: Normal)   Pulse 71   Temp 97.8 F (36.6 C) (Oral)   Resp 16   Wt 139 lb 6.4 oz (63.2 kg)   SpO2 94%   BMI 23.20 kg/m  Vitals:   06/28/18 1532  BP: 108/64  Pulse: 71  Resp: 16  Temp: 97.8 F (36.6 C)  TempSrc: Oral  SpO2: 94%  Weight: 139 lb 6.4 oz (63.2 kg)     Physical Exam  General Appearance:    Alert, cooperative, no distress  Eyes:    PERRL, conjunctiva/corneas clear, EOM's intact       Lungs:     Clear to auscultation bilaterally, respirations unlabored  Heart:    Regular rate and rhythm  MS:   Tender over right scapul and trapezius. Bilateral CVA tenderness. Pain reproduced with neck rotation and flexion. FROM of shoulders .       Results for orders placed or performed in visit on 06/28/18  POCT Urinalysis Dipstick  Result Value Ref Range   Color, UA dark yellow    Clarity, UA cloudy    Glucose, UA Negative Negative   Bilirubin, UA negative    Ketones, UA 5    Spec  Grav, UA >=1.030 (A) 1.010 - 1.025   Blood, UA hemolyzed large    pH, UA 5.0 5.0 - 8.0   Protein, UA Positive (A) Negative   Urobilinogen, UA 0.2 0.2 or 1.0 E.U./dL   Nitrite, UA negative    Leukocytes, UA Moderate (2+) (A) Negative   Appearance     Odor           Assessment & Plan:     1. Dysuria  - POCT Urinalysis Dipstick - Urine Culture  2. Urinary tract infection with hematuria, site unspecified  - cephALEXin (KEFLEX) 500 MG capsule; Take 1 capsule (500 mg total) by mouth 4 (four) times daily for 7 days.  Dispense: 28 capsule; Refill: 0 - Urine Culture  3. Back spasm  - cyclobenzaprine (FLEXERIL) 5 MG tablet; Take 1-2 tablets (5-10 mg total) by mouth 3 (three) times daily as needed for muscle spasms.  Dispense: 30 tablet; Refill: 1 - naproxen (NAPROSYN) 500 MG tablet; Take 1 tablet (500 mg total) by mouth 2 (two) times daily with a meal.   Dispense: 20 tablet; Refill: 0  Call if symptoms change or if not rapidly improving.          Mila Merry, MD  [facility]

## 2018-06-30 LAB — URINE CULTURE

## 2018-07-28 ENCOUNTER — Ambulatory Visit: Payer: Federal, State, Local not specified - PPO | Admitting: Physician Assistant

## 2018-07-28 ENCOUNTER — Encounter: Payer: Self-pay | Admitting: Physician Assistant

## 2018-07-28 VITALS — BP 132/74 | HR 76 | Temp 98.7°F | Wt 137.6 lb

## 2018-07-28 DIAGNOSIS — S39012A Strain of muscle, fascia and tendon of lower back, initial encounter: Secondary | ICD-10-CM

## 2018-07-28 MED ORDER — MELOXICAM 15 MG PO TABS: ORAL_TABLET | ORAL | 0 refills | Status: DC

## 2018-07-28 MED ORDER — CYCLOBENZAPRINE HCL 5 MG PO TABS: 5.0000 mg | ORAL_TABLET | Freq: Three times a day (TID) | ORAL | 0 refills | Status: DC | PRN

## 2018-07-28 NOTE — Patient Instructions (Signed)
Low Back Strain A strain is a stretch or tear in a muscle or the strong cords of tissue that attach muscle to bone (tendons). Strains of the lower back (lumbar spine) are a common cause of low back pain. A strain occurs when muscles or tendons are torn or are stretched beyond their limits. The muscles may become inflamed, resulting in pain and sudden muscle tightening (spasms). A strain can happen suddenly due to an injury (trauma), or it can develop gradually due to overuse. There are three types of strains:  Grade 1 is a mild strain involving a minor tear of the muscle fibers or tendons. This may cause some pain but no loss of muscle strength.  Grade 2 is a moderate strain involving a partial tear of the muscle fibers or tendons. This causes more severe pain and some loss of muscle strength.  Grade 3 is a severe strain involving a complete tear of the muscle or tendon. This causes severe pain and complete or nearly complete loss of muscle strength. What are the causes? This condition may be caused by:  Trauma, such as a fall or a hit to the body.  Twisting or overstretching the back. This may result from doing activities that require a lot of energy, such as lifting heavy objects. What increases the risk? The following factors may increase your risk of getting this condition:  Playing contact sports.  Participating in sports or activities that put excessive stress on the back and require a lot of bending and twisting, including:  Lifting weights or heavy objects.  Gymnastics.  Soccer.  Figure skating.  Snowboarding.  Being overweight or obese.  Having poor strength and flexibility. What are the signs or symptoms? Symptoms of this condition may include:  Sharp or dull pain in the lower back that does not go away. Pain may extend to the buttocks.  Stiffness.  Limited range of motion.  Inability to stand up straight due to stiffness or pain.  Muscle spasms. How is this  diagnosed?   This condition may be diagnosed based on:  Your symptoms.  Your medical history.  A physical exam.  Your health care provider may push on certain areas of your back to determine the source of your pain.  You may be asked to bend forward, backward, and side to side to assess the severity of your pain and your range of motion.  Imaging tests, such as:  X-rays.  MRI. How is this treated? Treatment for this condition may include:  Applying heat and cold to the affected area.  Medicines to help relieve pain and to relax your muscles (muscle relaxants).  NSAIDs to help reduce swelling and discomfort.  Physical therapy. When your symptoms improve, it is important to gradually return to your normal routine as soon as possible to reduce pain, avoid stiffness, and avoid loss of muscle strength. Generally, symptoms should improve within 6 weeks of treatment. However, recovery time varies. Follow these instructions at home: Managing pain, stiffness, and swelling  If directed, apply ice to the injured area during the first 24 hours after your injury.  Put ice in a plastic bag.  Place a towel between your skin and the bag.  Leave the ice on for 20 minutes, 2-3 times a day.  If directed, apply heat to the affected area as often as told by your health care provider. Use the heat source that your health care provider recommends, such as a moist heat pack or a heating pad.    Place a towel between your skin and the heat source.  Leave the heat on for 20-30 minutes.  Remove the heat if your skin turns bright red. This is especially important if you are unable to feel pain, heat, or cold. You may have a greater risk of getting burned. Activity  Rest and return to your normal activities as told by your health care provider. Ask your health care provider what activities are safe for you.  Avoid activities that take a lot of effort (are strenuous) for as long as told by your  health care provider.  Do exercises as told by your health care provider. General instructions  Take over-the-counter and prescription medicines only as told by your health care provider.  If you have questions or concerns about safety while taking pain medicine, talk with your health care provider.  Do not drive or operate heavy machinery until you know how your pain medicine affects you.  Do not use any tobacco products, such as cigarettes, chewing tobacco, and e-cigarettes. Tobacco can delay bone healing. If you need help quitting, ask your health care provider.  Keep all follow-up visits as told by your health care provider. This is important. How is this prevented?  Warm up and stretch before being active.  Cool down and stretch after being active.  Give your body time to rest between periods of activity.  Avoid:  Being physically inactive for long periods at a time.  Exercising or playing sports when you are tired or in pain.  Use correct form when playing sports and lifting heavy objects.  Use good posture when sitting and standing.  Maintain a healthy weight.  Sleep on a mattress with medium firmness to support your back.  Make sure to use equipment that fits you, including shoes that fit well.  Be safe and responsible while being active to avoid falls.  Do at least 150 minutes of moderate-intensity exercise each week, such as brisk walking or water aerobics. Try a form of exercise that takes stress off your back, such as swimming or stationary cycling.  Maintain physical fitness, including:  Strength.  Flexibility.  Cardiovascular fitness.  Endurance. Contact a health care provider if:  Your back pain does not improve after 6 weeks of treatment.  Your symptoms get worse. Get help right away if:  Your back pain is severe.  You are unable to stand or walk.  You develop pain in your legs.  You develop weakness in your buttocks or legs.  You have  difficulty controlling when you urinate or when you have a bowel movement. This information is not intended to replace advice given to you by your health care provider. Make sure you discuss any questions you have with your health care provider. Document Released: 09/07/2005 Document Revised: 05/14/2016 Document Reviewed: 06/19/2015 Elsevier Interactive Patient Education  2017 [employer].  

## 2018-07-28 NOTE — Progress Notes (Signed)
Patient: Kristen Wright Female    DOB: May 13, 1961   57 y.o.   MRN: [id number] Visit Date: 07/28/2018  Today's Provider: Trey Sailors, PA-C   Chief Complaint  Patient presents with  . Back Pain   Subjective:    HPI  Back Pain  Patient presents today low back pain since Sunday, 07/24/2018. Patient states that she was doing yard work on Saturday and Sunday and afterwards that's when her back begin to hurt. Back hurts worse when walking and bending over. Pain is in low back. Patient states she unable to sleep and the pain is sharp. Patient has been taking Aleve and using hot heat pads.  Allergies  Allergen Reactions  . Mirtazapine     Nightmares     Current Outpatient Medications:  .  ALPRAZolam (XANAX) 0.5 MG tablet, TAKE 1 TABLET BY MOUTH EVERY 6 HOURS AS NEEDED, Disp: 30 tablet, Rfl: 5 .  buPROPion (WELLBUTRIN SR) 150 MG 12 hr tablet, TAKE 1 TABLET BY MOUTH TWICE DAILY, Disp: 60 tablet, Rfl: 6 .  clonazePAM (KLONOPIN) 0.5 MG tablet, TAKE 1 TABLET BY MOUTH EVERY NIGHT AT BEDTIME, Disp: 30 tablet, Rfl: 3 .  cyclobenzaprine (FLEXERIL) 5 MG tablet, Take 1-2 tablets (5-10 mg total) by mouth 3 (three) times daily as needed for muscle spasms., Disp: 30 tablet, Rfl: 1 .  escitalopram (LEXAPRO) 20 MG tablet, TAKE 1 TABLET BY MOUTH ONCE DAILY, Disp: 30 tablet, Rfl: 6 .  lovastatin (MEVACOR) 20 MG tablet, TAKE 1 TABLET BY MOUTH DAILY, Disp: 30 tablet, Rfl: 12 .  naproxen (NAPROSYN) 500 MG tablet, Take 1 tablet (500 mg total) by mouth 2 (two) times daily with a meal., Disp: 20 tablet, Rfl: 0 .  traZODone (DESYREL) 150 MG tablet, TAKE 1 TABLET BY MOUTH EVERY EVENING, Disp: 30 tablet, Rfl: 5  Review of Systems  Constitutional: Negative.   HENT: Negative.   Respiratory: Negative.   Cardiovascular: Negative.   Gastrointestinal: Negative.   Genitourinary: Negative.   Musculoskeletal: Positive for back pain.  Neurological: Negative.     Social History   Tobacco Use  .  Smoking status: Current Every Day Smoker    Packs/day: 0.50    Years: 30.00    Pack years: 15.00    Types: Cigarettes  . Smokeless tobacco: Never Used  Substance Use Topics  . Alcohol use: Yes    Alcohol/week: 0.0 standard drinks   Objective:   BP 132/74 (BP Location: Right Arm, Patient Position: Sitting, Cuff Size: Normal)   Pulse 76   Temp 98.7 F (37.1 C) (Oral)   Wt 137 lb 9.6 oz (62.4 kg)   SpO2 98%   BMI 22.90 kg/m  Vitals:   07/28/18 1006  BP: 132/74  Pulse: 76  Temp: 98.7 F (37.1 C)  TempSrc: Oral  SpO2: 98%  Weight: 137 lb 9.6 oz (62.4 kg)     Physical Exam  Constitutional: She is oriented to person, place, and time. She appears well-developed and well-nourished.  Cardiovascular: Normal rate and regular rhythm.  Pulmonary/Chest: Effort normal and breath sounds normal.  Musculoskeletal: She exhibits tenderness.       Lumbar back: She exhibits decreased range of motion, tenderness and pain. She exhibits no bony tenderness, no swelling, no edema and no deformity.  Neurological: She is alert and oriented to person, place, and time.  Skin: Skin is warm and dry.  Psychiatric: She has a normal mood and affect. Her behavior is normal.  Assessment & Plan:     1. Strain of lumbar region, initial encounter  Cautioned to take only tylenol with Mobic and to separate flexeril by several hours with benzodiazepines.   - meloxicam (MOBIC) 15 MG tablet; Take one pill daily for up to one week.  Dispense: 30 tablet; Refill: 0 - cyclobenzaprine (FLEXERIL) 5 MG tablet; Take 1 tablet (5 mg total) by mouth 3 (three) times daily as needed for muscle spasms.  Dispense: 30 tablet; Refill: 0  Return if symptoms worsen or fail to improve.  The entirety of the information documented in the History of Present Illness, Review of Systems and Physical Exam were personally obtained by me. Portions of this information were initially documented by Fonnie Birkenhead, CMA and reviewed by  me for thoroughness and accuracy.           Trey Sailors, PA-C  [facility]

## 2018-09-12 ENCOUNTER — Other Ambulatory Visit: Payer: Self-pay | Admitting: Family Medicine

## 2018-09-17 ENCOUNTER — Other Ambulatory Visit: Payer: Self-pay | Admitting: Family Medicine

## 2018-09-17 DIAGNOSIS — F329 Major depressive disorder, single episode, unspecified: Secondary | ICD-10-CM

## 2018-09-17 DIAGNOSIS — F32A Depression, unspecified: Secondary | ICD-10-CM

## 2018-10-14 ENCOUNTER — Other Ambulatory Visit: Payer: Self-pay | Admitting: Family Medicine

## 2018-10-14 ENCOUNTER — Encounter: Payer: Self-pay | Admitting: Family Medicine

## 2018-10-14 ENCOUNTER — Ambulatory Visit: Payer: Federal, State, Local not specified - PPO | Admitting: Family Medicine

## 2018-10-14 VITALS — BP 118/73 | HR 75 | Temp 97.8°F | Resp 16 | Wt 134.0 lb

## 2018-10-14 DIAGNOSIS — M21619 Bunion of unspecified foot: Secondary | ICD-10-CM

## 2018-10-14 DIAGNOSIS — L84 Corns and callosities: Secondary | ICD-10-CM

## 2018-10-14 MED ORDER — ALPRAZOLAM 0.5 MG PO TABS: 0.5000 mg | ORAL_TABLET | Freq: Four times a day (QID) | ORAL | 2 refills | Status: DC | PRN

## 2018-10-14 NOTE — Progress Notes (Signed)
Patient: Kristen HouseholderBecky A Milone Female    DOB: June 22, 1961   58 y.o.   MRN: [id number] Visit Date: 10/14/2018  Today's Provider: Mila Merryonald Fisher, MD   Chief Complaint  Patient presents with  . Foot Problem   Subjective:     HPI Foot problems: Patient complains of pain in both feet after walking. The pain is worse in her right foot. She has noticed that there are bumps between her toes that are painful to tough. Patient describes the pain as a burning sensation. She has tried OTC ibuprofen and soaking feet which hasn't helped. She reports that she has had bunions for several years, that has progressively worsened.    Allergies  Allergen Reactions  . Mirtazapine     Nightmares     Current Outpatient Medications:  .  ALPRAZolam (XANAX) 0.5 MG tablet, TAKE 1 TABLET BY MOUTH EVERY 6 HOURS AS NEEDED, Disp: 30 tablet, Rfl: 5 .  buPROPion (WELLBUTRIN SR) 150 MG 12 hr tablet, TAKE 1 TABLET BY MOUTH TWICE DAILY, Disp: 60 tablet, Rfl: 6 .  clonazePAM (KLONOPIN) 0.5 MG tablet, TAKE 1 TABLET BY MOUTH EVERY NIGHT AT BEDTIME, Disp: 30 tablet, Rfl: 5 .  escitalopram (LEXAPRO) 20 MG tablet, TAKE 1 TABLET BY MOUTH ONCE DAILY, Disp: 30 tablet, Rfl: 6 .  lovastatin (MEVACOR) 20 MG tablet, TAKE 1 TABLET BY MOUTH DAILY, Disp: 30 tablet, Rfl: 12 .  naproxen (NAPROSYN) 500 MG tablet, Take 1 tablet (500 mg total) by mouth 2 (two) times daily with a meal., Disp: 20 tablet, Rfl: 0 .  cyclobenzaprine (FLEXERIL) 5 MG tablet, Take 1 tablet (5 mg total) by mouth 3 (three) times daily as needed for muscle spasms. (Patient not taking: Reported on 10/14/2018), Disp: 30 tablet, Rfl: 0  Review of Systems  Constitutional: Negative for appetite change, chills, fatigue and fever.  Respiratory: Negative for chest tightness and shortness of breath.   Cardiovascular: Negative for chest pain and palpitations.  Gastrointestinal: Negative for abdominal pain, nausea and vomiting.  Musculoskeletal: Positive for arthralgias  (pain in both feet).  Neurological: Negative for dizziness and weakness.    Social History   Tobacco Use  . Smoking status: Current Every Day Smoker    Packs/day: 0.50    Years: 30.00    Pack years: 15.00    Types: Cigarettes  . Smokeless tobacco: Never Used  Substance Use Topics  . Alcohol use: Yes    Alcohol/week: 0.0 standard drinks    Comment: once a month      Objective:   BP 118/73 (BP Location: Left Arm, Patient Position: Sitting, Cuff Size: Normal)   Pulse 75   Temp 97.8 F (36.6 C) (Oral)   Resp 16   Wt 134 lb (60.8 kg)   SpO2 95% Comment: room air  BMI 22.30 kg/m  Vitals:   10/14/18 1508  BP: 118/73  Pulse: 75  Resp: 16  Temp: 97.8 F (36.6 C)  TempSrc: Oral  SpO2: 95%  Weight: 134 lb (60.8 kg)     Physical Exam  General appearance: alert, well developed, well nourished, cooperative and in no distress Head: Normocephalic, without obvious abnormality, atraumatic Respiratory: Respirations even and unlabored, normal respiratory rate Extremities: medium sized bunion right medial foot and several small tender callus like lesions between toes of right foot.      Assessment & Plan    1. Callus of foot  - Ambulatory referral to Podiatry  2. Bunion of unspecified foot  -  Ambulatory referral to Podiatry     Mila Merry, MD  [facility]

## 2018-10-14 NOTE — Patient Instructions (Signed)
.   Please review the attached list of medications and notify my office if there are any errors.   . Please bring all of your medications to every appointment so we can make sure that our medication list is the same as yours.   

## 2018-10-17 ENCOUNTER — Telehealth: Payer: Self-pay | Admitting: Family Medicine

## 2018-10-17 NOTE — Telephone Encounter (Signed)
Spoke with the pharmacy tech at [facility].  Pt did pick up the 30 tablets.   Thanks,   -Vernona Rieger

## 2018-10-17 NOTE — Telephone Encounter (Signed)
Did she pick up the 30?

## 2018-10-17 NOTE — Telephone Encounter (Signed)
OK, please advise patient that I did sent prescription for 90 tablets last week, but the pharmacy must have filled the old prescription instead of the new one.   Please call pharmacy and tell them change quantities on refills to #90 with 2 additional refills.

## 2018-10-17 NOTE — Telephone Encounter (Signed)
Pt calling because refill was only at 30 tablets and was supposed to go up to 90 tablets of ALPRAZolam (XANAX) 0.5 MG tablet.  Please advise.  Thanks, [employer]

## 2018-10-19 NOTE — Telephone Encounter (Signed)
Spoke to pharmacist at [facility] and they cancelled the rx for the 30 days and advised that pt can pick up the remaining rx on 10/20/18.  I called and advised pt that she can pick up the rx on 10/20/18 in the afternoon per [facility] pharmacy.  dbs

## 2018-10-31 ENCOUNTER — Encounter: Payer: Self-pay | Admitting: Podiatry

## 2018-10-31 ENCOUNTER — Ambulatory Visit: Payer: Federal, State, Local not specified - PPO | Admitting: Podiatry

## 2018-10-31 ENCOUNTER — Ambulatory Visit (INDEPENDENT_AMBULATORY_CARE_PROVIDER_SITE_OTHER): Payer: Federal, State, Local not specified - PPO

## 2018-10-31 VITALS — BP 134/82 | HR 63

## 2018-10-31 DIAGNOSIS — M2012 Hallux valgus (acquired), left foot: Secondary | ICD-10-CM

## 2018-10-31 DIAGNOSIS — M2041 Other hammer toe(s) (acquired), right foot: Secondary | ICD-10-CM | POA: Diagnosis not present

## 2018-10-31 DIAGNOSIS — M2010 Hallux valgus (acquired), unspecified foot: Secondary | ICD-10-CM | POA: Diagnosis not present

## 2018-10-31 NOTE — Progress Notes (Signed)
This patient presents the office with chief complaint of painful bunions and calluses caused by the corns both feet. She experiences numbness and tingling right foot at bunion and corn sited.  She says she has had bunions on her feet for years and they are becoming increasingly painful.  She says the bunion on her right foot is worse than her left.  She says she experiences pain by the end of the day.  She says that she has attempted separating her toes to help to prevent the corns from developing but has been unsuccessful.  She is also used acid to treat the corns and the pain has not resolved.  She presents the office today for an evaluation and treatment of her painful bunions both feet.  Patient also desires to discuss the corns on her toes both feet.  General Appearance  Alert, conversant and in no acute stress.  Vascular  Dorsalis pedis and posterior tibial  pulses are palpable  bilaterally.  Capillary return is within normal limits  bilaterally. Temperature is within normal limits  bilaterally.  Neurologic  Senn-Weinstein monofilament wire test within normal limits  bilaterally. Muscle power within normal limits bilaterally.  Nails Thick disfigured discolored nails with subungual debris  from hallux to fifth toes bilaterally. No evidence of bacterial infection or drainage bilaterally.  Orthopedic  No limitations of motion  feet .  No crepitus or effusions noted.  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  Adductovarus fifth digit  B/L with right developing corn at medial aspect DIPJ right.  Skin  normotropic skin with no porokeratosis noted bilaterally.  No signs of infections or ulcers noted.  Pinch callus right.  HAV B/L  Hammer toes  B/L.  IE  Xrays taken reveal prominent dorsomedial exostosis first MPJ bilaterally.  Elongated third met noted.  Discussed conservative versus surgical treatment with this patient.  She desires to proceed with a surgical consultation and plan  future surgery for the correction and elimination of her bunions.  Patient has an appointment with Dr. Karle Starch DPM

## 2018-11-02 ENCOUNTER — Encounter: Payer: Self-pay | Admitting: Podiatry

## 2018-11-02 ENCOUNTER — Ambulatory Visit: Payer: Federal, State, Local not specified - PPO | Admitting: Podiatry

## 2018-11-02 DIAGNOSIS — M2041 Other hammer toe(s) (acquired), right foot: Secondary | ICD-10-CM

## 2018-11-02 NOTE — Progress Notes (Signed)
She presents today from Dr. Collene Schlichter for surgical consult regarding her right foot.  She states that is becoming more more painful over time and is affecting her ability to perform her daily activities and enjoying life.  She denies any trauma in the past denies any changes in her past medical history medications allergies surgeries and social history there were provided on initial evaluation.  Objective: Vital signs are stable alert and oriented x3.  Pulses are palpable.  Neurologic sensorium is intact dT reflexes are intact muscle strength is normal symmetrical.  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  She also has a reactive bone growth to the lateral aspect of the hallux which is resulting in juxtaposition reactive growing bone growth to the second toe.  The second toe is also mildly elevated at the level of the metatarsal phalangeal joint.  She also has a painful fifth toe with adductovarus rotation and a painful reactive hyperkeratotic lesion medial aspect.  Radiographs confirm hallux valgus with an increase in the first intermetatarsal angle and hallux abductus angle with a spur to the lateral aspects of the proximal aspect of the distal phalanx.  She also has hypertrophic condyle to the head of the proximal phalanx laterally.  Again another spurs noted medial aspect of the distal phalanx fifth digit right foot with adductovarus rotation.  No open lesions or wounds are noted.  Mild thickened dystrophic possibly mycotic nails.  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  Plan: At this point we consented her for an [facility] bunion repair and a lateral exostectomy of the distal phalanx hallux medial exostectomy of the PIPJ and release of the second metatarsal phalangeal joint.  Derotational arthroplasty of the fifth digit right with an exostectomy fifth digit right.  I answered all  the questions regarding these procedures to the best of my ability in layman's terms she understood and was amenable to it signed a 3 page of the consent form.  We also dispensed a Cam walker as well as information regarding the surgery center anesthesia and instructions for the morning of surgery.  On follow-up with her in the near future for surgical intervention.  She knows that she will be out of work anywhere from 8 to 12 weeks

## 2018-11-02 NOTE — Patient Instructions (Signed)
Pre-Operative Instructions  Congratulations, you have decided to take an important step towards improving your quality of life.  You can be assured that the doctors and staff at [facility] will be with you every step of the way.  Here are some important things you should know:  1. Plan to be at the surgery center/hospital at least 1 (one) hour prior to your scheduled time, unless otherwise directed by the surgical center/hospital staff.  You must have a responsible adult accompany you, remain during the surgery and drive you home.  Make sure you have directions to the surgical center/hospital to ensure you arrive on time. 2. If you are having surgery at [facility] or [facility] hospitals, you will need a copy of your medical history and physical form from your family physician within one month prior to the date of surgery. We will give you a form for your primary physician to complete.  3. We make every effort to accommodate the date you request for surgery.  However, there are times where surgery dates or times have to be moved.  We will contact you as soon as possible if a change in schedule is required.   4. No aspirin/ibuprofen for one week before surgery.  If you are on aspirin, any non-steroidal anti-inflammatory medications (Mobic, Aleve, Ibuprofen) should not be taken seven (7) days prior to your surgery.  You make take Tylenol for pain prior to surgery.  5. Medications - If you are taking daily heart and blood pressure medications, seizure, reflux, allergy, asthma, anxiety, pain or diabetes medications, make sure you notify the surgery center/hospital before the day of surgery so they can tell you which medications you should take or avoid the day of surgery. 6. No food or drink after midnight the night before surgery unless directed otherwise by surgical center/hospital staff. 7. No alcoholic beverages 24-hours prior to surgery.  No smoking 24-hours prior or 24-hours after  surgery. 8. Wear loose pants or shorts. They should be loose enough to fit over bandages, boots, and casts. 9. Don't wear slip-on shoes. Sneakers are preferred. 10. Bring your boot with you to the surgery center/hospital.  Also bring crutches or a walker if your physician has prescribed it for you.  If you do not have this equipment, it will be provided for you after surgery. 11. If you have not been contacted by the surgery center/hospital by the day before your surgery, call to confirm the date and time of your surgery. 12. Leave-time from work may vary depending on the type of surgery you have.  Appropriate arrangements should be made prior to surgery with your employer. 13. Prescriptions will be provided immediately following surgery by your doctor.  Fill these as soon as possible after surgery and take the medication as directed. Pain medications will not be refilled on weekends and must be approved by the doctor. 14. Remove nail polish on the operative foot and avoid getting pedicures prior to surgery. 15. Wash the night before surgery.  The night before surgery wash the foot and leg well with water and the antibacterial soap provided. Be sure to pay special attention to beneath the toenails and in between the toes.  Wash for at least three (3) minutes. Rinse thoroughly with water and dry well with a towel.  Perform this wash unless told not to do so by your physician.  Enclosed: 1 Ice pack (please put in freezer the night before surgery)   1 Hibiclens skin cleaner     Pre-op instructions  If you have any questions regarding the instructions, please do not hesitate to call our office.  [place]: [address], [place], [place] [address] -- [phone]  [place]: [address] [place], [place] [address] -- [phone]  [place]: [address]  [place], [place] [address] -- [phone]  [place]: [address], [address], [place], [place] [address] -- [phone]  Website:  [email or web address] 

## 2018-11-04 ENCOUNTER — Telehealth: Payer: Self-pay | Admitting: *Deleted

## 2018-11-04 NOTE — Telephone Encounter (Signed)
"  Dr. Al Corpus asked me to schedule an appointment for surgery.  If you could, give me a call."

## 2018-11-08 NOTE — Telephone Encounter (Signed)
"  I have been calling to schedule my surgery and I've been sent all over the place.  No one has called me back."  I am sorry, about that.  Do you have a date you would like?  "No, whatever he has available."  Dr. Al Corpus can do it on March 13.  "Is that the earliest he has?"  Yes, it's the earliest.  "Schedule me for then."  Someone from the surgical center will call you a day or two prior to your surgery date and they will give you your arrival time.  You need to go online and register with for the surgical center, instructions are in the brochure that we gave you.  "Okay, I'll take care of it."

## 2018-11-11 ENCOUNTER — Other Ambulatory Visit: Payer: Self-pay | Admitting: Family Medicine

## 2018-11-30 ENCOUNTER — Other Ambulatory Visit: Payer: Self-pay | Admitting: Podiatry

## 2018-11-30 MED ORDER — CEPHALEXIN 500 MG PO CAPS: 500.0000 mg | ORAL_CAPSULE | Freq: Three times a day (TID) | ORAL | 0 refills | Status: DC

## 2018-11-30 MED ORDER — ONDANSETRON HCL 4 MG PO TABS: 4.0000 mg | ORAL_TABLET | Freq: Three times a day (TID) | ORAL | 0 refills | Status: DC | PRN

## 2018-11-30 MED ORDER — OXYCODONE-ACETAMINOPHEN 10-325 MG PO TABS: 1.0000 | ORAL_TABLET | Freq: Four times a day (QID) | ORAL | 0 refills | Status: AC | PRN

## 2018-12-02 ENCOUNTER — Encounter: Payer: Self-pay | Admitting: Podiatry

## 2018-12-02 DIAGNOSIS — M2041 Other hammer toe(s) (acquired), right foot: Secondary | ICD-10-CM | POA: Diagnosis not present

## 2018-12-02 DIAGNOSIS — M21611 Bunion of right foot: Secondary | ICD-10-CM | POA: Diagnosis not present

## 2018-12-02 DIAGNOSIS — M898X7 Other specified disorders of bone, ankle and foot: Secondary | ICD-10-CM | POA: Diagnosis not present

## 2018-12-02 DIAGNOSIS — M25871 Other specified joint disorders, right ankle and foot: Secondary | ICD-10-CM | POA: Diagnosis not present

## 2018-12-02 DIAGNOSIS — M2010 Hallux valgus (acquired), unspecified foot: Secondary | ICD-10-CM | POA: Diagnosis not present

## 2018-12-02 DIAGNOSIS — Z01818 Encounter for other preprocedural examination: Secondary | ICD-10-CM | POA: Diagnosis not present

## 2018-12-02 DIAGNOSIS — M2570 Osteophyte, unspecified joint: Secondary | ICD-10-CM | POA: Diagnosis not present

## 2018-12-02 DIAGNOSIS — M25571 Pain in right ankle and joints of right foot: Secondary | ICD-10-CM | POA: Diagnosis not present

## 2018-12-02 DIAGNOSIS — M2040 Other hammer toe(s) (acquired), unspecified foot: Secondary | ICD-10-CM | POA: Diagnosis not present

## 2018-12-02 DIAGNOSIS — M25774 Osteophyte, right foot: Secondary | ICD-10-CM | POA: Diagnosis not present

## 2018-12-02 DIAGNOSIS — M2011 Hallux valgus (acquired), right foot: Secondary | ICD-10-CM | POA: Diagnosis not present

## 2018-12-02 HISTORY — PX: BUNIONECTOMY: SHX129

## 2018-12-07 ENCOUNTER — Ambulatory Visit (INDEPENDENT_AMBULATORY_CARE_PROVIDER_SITE_OTHER): Payer: Self-pay | Admitting: Podiatry

## 2018-12-07 ENCOUNTER — Other Ambulatory Visit: Payer: Self-pay

## 2018-12-07 ENCOUNTER — Ambulatory Visit (INDEPENDENT_AMBULATORY_CARE_PROVIDER_SITE_OTHER): Payer: Federal, State, Local not specified - PPO

## 2018-12-07 VITALS — BP 116/77 | HR 69 | Temp 97.5°F

## 2018-12-07 DIAGNOSIS — M2041 Other hammer toe(s) (acquired), right foot: Secondary | ICD-10-CM

## 2018-12-07 DIAGNOSIS — Z9889 Other specified postprocedural states: Secondary | ICD-10-CM

## 2018-12-07 NOTE — Progress Notes (Signed)
She presents today for first postop visit date of surgery 12/02/2018 status post Kristen Wright bunionectomy capsulotomy of the second right hammertoe and the fifth right toe with exostectomy first second and fifth the fifth metatarsal osteotomy she states that she is doing great I am states that she cannot really sleep really go because medication is not really helping her sleep she denies fever chills nausea vomiting muscle aches pains calf pain back pain chest pain shortness of breath.  Objective: Vital signs are stable alert oriented x3 dressed her dressing was removed demonstrates no erythematous mild edema no cellulitis drainage or odor to the right foot.  She has good range of motion of the toe she is very happy with the way that they look and of the outcome.  Assessment: Well-healing surgical foot.  Plan: Redressed today dressed a compressive dressing.  Radiographs taken today demonstrate osteotomies appear to be intact exostectomy's are complete hammertoe fifth is intact all internal fixation is intact.

## 2018-12-14 ENCOUNTER — Encounter: Payer: Self-pay | Admitting: Family Medicine

## 2018-12-14 ENCOUNTER — Other Ambulatory Visit: Payer: Self-pay

## 2018-12-14 ENCOUNTER — Ambulatory Visit (INDEPENDENT_AMBULATORY_CARE_PROVIDER_SITE_OTHER): Payer: Federal, State, Local not specified - PPO | Admitting: Podiatry

## 2018-12-14 DIAGNOSIS — M2011 Hallux valgus (acquired), right foot: Secondary | ICD-10-CM

## 2018-12-14 DIAGNOSIS — M2041 Other hammer toe(s) (acquired), right foot: Secondary | ICD-10-CM

## 2018-12-14 DIAGNOSIS — M898X7 Other specified disorders of bone, ankle and foot: Secondary | ICD-10-CM

## 2018-12-14 DIAGNOSIS — Z9889 Other specified postprocedural states: Secondary | ICD-10-CM

## 2018-12-14 NOTE — Progress Notes (Signed)
She presents today date of surgery 12/02/2018 status post Kristen Wright bunionectomy right capsulotomy metatarsophalangeal joint exostectomy hammertoe.  States that she is doing just great denies fever chills nausea vomiting muscle aches pains has not been walking on it states that she is been utilizing her scooter.  Objective: Vital signs are stable alert oriented x3 dressed her dressing intact was removed demonstrates all the sutures are intact margins well coapted sutures removed margins remain well coapted showed good range of motion of the first metatarsal phalangeal joint right.  Assessment: Well-healing surgical foot.  Plan: Redressed today dressed a compressive dressing over the next 2 to 3 days she may remove the dressing and start compression dressing and Darco shoe.  And I will follow-up with her in about 2 weeks.

## 2018-12-28 ENCOUNTER — Ambulatory Visit (INDEPENDENT_AMBULATORY_CARE_PROVIDER_SITE_OTHER): Payer: Federal, State, Local not specified - PPO

## 2018-12-28 ENCOUNTER — Ambulatory Visit (INDEPENDENT_AMBULATORY_CARE_PROVIDER_SITE_OTHER): Payer: Federal, State, Local not specified - PPO | Admitting: Podiatry

## 2018-12-28 ENCOUNTER — Other Ambulatory Visit: Payer: Self-pay

## 2018-12-28 DIAGNOSIS — Z9889 Other specified postprocedural states: Secondary | ICD-10-CM

## 2018-12-28 DIAGNOSIS — M2011 Hallux valgus (acquired), right foot: Secondary | ICD-10-CM

## 2018-12-28 NOTE — Progress Notes (Signed)
She presents today date of surgery 12/02/2018 status post Eliberto Ivory bunionectomy right capsulotomy metatarsophalangeal joint second right hammertoe repair fifth right exostectomy first second and fifth right states that is still throbs at nighttime she is also noticed that it bleeds some after she showers.  Objective: There is no erythematous mild edema no cellulitis drainage odor she has limited range of motion of the first metatarsophalangeal joint due to tenderness.  Radiographs taken today demonstrate well aligned first metatarsal with screw fixation.  Slight soft tissue swelling of the first and metatarsal space otherwise looks pretty good.  Assessment: Well-healing surgical foot.  Plan: We will allow her to continue to wear her Darco shoe for another couple weeks allowing her to increase her range of motion and exercise of the joint follow-up with her in 2 weeks for another set of x-rays and regular shoes.

## 2019-01-11 ENCOUNTER — Other Ambulatory Visit: Payer: Self-pay

## 2019-01-11 ENCOUNTER — Ambulatory Visit (INDEPENDENT_AMBULATORY_CARE_PROVIDER_SITE_OTHER): Payer: Federal, State, Local not specified - PPO

## 2019-01-11 ENCOUNTER — Encounter: Payer: Self-pay | Admitting: Podiatry

## 2019-01-11 ENCOUNTER — Other Ambulatory Visit: Payer: Federal, State, Local not specified - PPO

## 2019-01-11 ENCOUNTER — Ambulatory Visit (INDEPENDENT_AMBULATORY_CARE_PROVIDER_SITE_OTHER): Payer: Federal, State, Local not specified - PPO | Admitting: Podiatry

## 2019-01-11 VITALS — Temp 98.2°F

## 2019-01-11 DIAGNOSIS — M2041 Other hammer toe(s) (acquired), right foot: Secondary | ICD-10-CM | POA: Diagnosis not present

## 2019-01-11 DIAGNOSIS — M2011 Hallux valgus (acquired), right foot: Secondary | ICD-10-CM

## 2019-01-11 DIAGNOSIS — M898X7 Other specified disorders of bone, ankle and foot: Secondary | ICD-10-CM

## 2019-01-11 DIAGNOSIS — Z9889 Other specified postprocedural states: Secondary | ICD-10-CM

## 2019-01-11 NOTE — Progress Notes (Signed)
She presents today for postop visit date of surgery December 02, 2018 status post Kristen Wright bunionectomy capsulotomy second metatarsal phalangeal joint hammertoe repair fifth right exostectomy first second and fifth.  She states that it hurts some but all in all is doing better than it was.  Objective: Vital signs are stable she is alert and oriented x3 there is minimal edema no erythema cellulitis drainage odor diminished range of motion because of pain and tenderness to the first metatarsal phalangeal joint.  No signs of infection.  Radiographs demonstrate a well-healing osteotomy screws in good position.  Much decrease in edema in the first intermetatarsal space.  Assessment: Well-healing surgical foot.  Plan: Encourage range of motion exercises encouraged trying to get into her regular shoe gear over the next month I will follow-up with her in 1 month for another set of x-rays if not improved in the range of motion physical therapy will be necessary.

## 2019-01-24 ENCOUNTER — Other Ambulatory Visit: Payer: Self-pay | Admitting: Family Medicine

## 2019-02-15 ENCOUNTER — Ambulatory Visit (INDEPENDENT_AMBULATORY_CARE_PROVIDER_SITE_OTHER): Payer: Federal, State, Local not specified - PPO | Admitting: Podiatry

## 2019-02-15 ENCOUNTER — Encounter: Payer: Federal, State, Local not specified - PPO | Admitting: Podiatry

## 2019-02-15 ENCOUNTER — Encounter: Payer: Self-pay | Admitting: Podiatry

## 2019-02-15 ENCOUNTER — Other Ambulatory Visit: Payer: Self-pay

## 2019-02-15 ENCOUNTER — Other Ambulatory Visit: Payer: Federal, State, Local not specified - PPO

## 2019-02-15 ENCOUNTER — Ambulatory Visit (INDEPENDENT_AMBULATORY_CARE_PROVIDER_SITE_OTHER): Payer: Federal, State, Local not specified - PPO

## 2019-02-15 VITALS — Temp 96.8°F

## 2019-02-15 DIAGNOSIS — M2041 Other hammer toe(s) (acquired), right foot: Secondary | ICD-10-CM

## 2019-02-15 DIAGNOSIS — M898X7 Other specified disorders of bone, ankle and foot: Secondary | ICD-10-CM | POA: Diagnosis not present

## 2019-02-15 DIAGNOSIS — M2011 Hallux valgus (acquired), right foot: Secondary | ICD-10-CM

## 2019-02-15 DIAGNOSIS — Z9889 Other specified postprocedural states: Secondary | ICD-10-CM

## 2019-02-15 NOTE — Progress Notes (Signed)
She presents today date of surgery 12/02/2018 status post Eliberto Ivory bunionectomy capsulotomy second metatarsal phalangeal joint hammertoe repair fifth right exostectomy first second and fifth on the right foot.  States that is been swollen still and is hard for me to get my foot into a shoe.  I do still think I will be able to get my foot into a shoe prior to going back to work.  Objective: Vital signs are stable she is alert and oriented x3 there is mild edema no erythema cellulitis drainage odor she has limited range of motion of the first metatarsophalangeal joint with considerable soreness.  Radiographs do demonstrate well-healing osteotomy with internal fixation in position.  Assessment: Well-healing surgical foot that I think is delayed because she is not exercising it as much as she needs to.  Plan: At this point I will send her to physical therapy to see if we can at least get her some motion and help take some of the tenderness and the swelling away so that she can get back to work.  We will extend her out of work paperwork for at least another month

## 2019-02-20 DIAGNOSIS — M21611 Bunion of right foot: Secondary | ICD-10-CM | POA: Diagnosis not present

## 2019-02-20 DIAGNOSIS — M25571 Pain in right ankle and joints of right foot: Secondary | ICD-10-CM | POA: Diagnosis not present

## 2019-02-27 DIAGNOSIS — M21611 Bunion of right foot: Secondary | ICD-10-CM | POA: Diagnosis not present

## 2019-02-27 DIAGNOSIS — M25571 Pain in right ankle and joints of right foot: Secondary | ICD-10-CM | POA: Diagnosis not present

## 2019-03-22 ENCOUNTER — Ambulatory Visit: Payer: Federal, State, Local not specified - PPO

## 2019-03-22 ENCOUNTER — Encounter: Payer: Federal, State, Local not specified - PPO | Admitting: Podiatry

## 2019-03-25 ENCOUNTER — Other Ambulatory Visit: Payer: Self-pay | Admitting: Family Medicine

## 2019-03-25 DIAGNOSIS — F32A Depression, unspecified: Secondary | ICD-10-CM

## 2019-03-25 DIAGNOSIS — F329 Major depressive disorder, single episode, unspecified: Secondary | ICD-10-CM

## 2019-04-03 NOTE — Progress Notes (Signed)
This encounter was created in error - please disregard.

## 2019-04-10 ENCOUNTER — Other Ambulatory Visit: Payer: Self-pay

## 2019-04-10 ENCOUNTER — Ambulatory Visit (INDEPENDENT_AMBULATORY_CARE_PROVIDER_SITE_OTHER): Payer: Federal, State, Local not specified - PPO | Admitting: Podiatry

## 2019-04-10 ENCOUNTER — Ambulatory Visit (INDEPENDENT_AMBULATORY_CARE_PROVIDER_SITE_OTHER): Payer: Federal, State, Local not specified - PPO

## 2019-04-10 ENCOUNTER — Encounter: Payer: Self-pay | Admitting: Podiatry

## 2019-04-10 VITALS — Temp 98.3°F

## 2019-04-10 DIAGNOSIS — M2041 Other hammer toe(s) (acquired), right foot: Secondary | ICD-10-CM

## 2019-04-10 DIAGNOSIS — M2011 Hallux valgus (acquired), right foot: Secondary | ICD-10-CM

## 2019-04-10 DIAGNOSIS — M898X7 Other specified disorders of bone, ankle and foot: Secondary | ICD-10-CM

## 2019-04-10 DIAGNOSIS — Z9889 Other specified postprocedural states: Secondary | ICD-10-CM

## 2019-04-10 NOTE — Progress Notes (Signed)
She presents today for follow-up of her husband bunionectomy right foot capsulotomy second metatarsal phalangeal joint right foot hammertoe repair fifth right exostectomy first and fifth.  She states that she did not go through physical therapy but 1 or 2 visits now she is returned back to work for the past 2 weeks states that was very painful when she first went back but is starting to do a little bit better now.  Objective: Vital signs are stable she alert and oriented x3 much decrease in edema foot appears to be perfectly normal she is got great range of motion of the first metatarsal phalangeal joint second toe sits rectus all surgical sites have gone on to heal up 100% radiographs taken today demonstrate well-healed osteotomies internal fixation is in good position.  Assessment: Well-healing surgical foot right.  Plan: Follow-up with me on an as-needed basis.

## 2019-05-15 ENCOUNTER — Encounter: Payer: Self-pay | Admitting: Physician Assistant

## 2019-05-15 ENCOUNTER — Ambulatory Visit (INDEPENDENT_AMBULATORY_CARE_PROVIDER_SITE_OTHER): Payer: Federal, State, Local not specified - PPO | Admitting: Physician Assistant

## 2019-05-15 VITALS — Temp 97.5°F

## 2019-05-15 DIAGNOSIS — K529 Noninfective gastroenteritis and colitis, unspecified: Secondary | ICD-10-CM

## 2019-05-15 DIAGNOSIS — R112 Nausea with vomiting, unspecified: Secondary | ICD-10-CM | POA: Diagnosis not present

## 2019-05-15 MED ORDER — ONDANSETRON 4 MG PO TBDP: 4.0000 mg | ORAL_TABLET | Freq: Three times a day (TID) | ORAL | 0 refills | Status: DC | PRN

## 2019-05-15 NOTE — Progress Notes (Signed)
Virtual Visit via Telephone Note  I connected with Kristen Wright on 05/15/19 at  9:00 AM EDT by telephone and verified that I am speaking with the correct person using two identifiers.  Location: Patient: Home Provider: [facility]   I discussed the limitations, risks, security and privacy concerns of performing an evaluation and management service by telephone and the availability of in person appointments. I also discussed with the patient that there may be a patient responsible charge related to this service. The patient expressed understanding and agreed to proceed.  Mar Daring, PA-C   Patient: Kristen Wright Female    DOB: 1961-02-12   58 y.o.   MRN: [id number] Visit Date: 05/15/2019  Today's Provider: Mar Daring, PA-C   No chief complaint on file.  Subjective:     HPI Kristen Wright is a 58 yr old female that presents today via telephone visit for nausea and vomiting. Symptoms started on Sunday after a night of drinking. She reports she cannot keep anything down and is vomiting after any intake. She reports vomiting almost 20 times yesterday and has vomited 5 times today. She has accompanied chills and fatigue. She denies fever (she has been checking) or diarrhea. No hematemesis. No melena or hematochezia.   Allergies  Allergen Reactions  . Mirtazapine     Nightmares     Current Outpatient Medications:  .  ALPRAZolam (XANAX) 0.5 MG tablet, TAKE 1 TABLET(0.5 MG) BY MOUTH EVERY 6 HOURS AS NEEDED, Disp: 90 tablet, Rfl: 3 .  clonazePAM (KLONOPIN) 0.5 MG tablet, TAKE 1 TABLET BY MOUTH EVERY NIGHT AT BEDTIME, Disp: 30 tablet, Rfl: 5 .  escitalopram (LEXAPRO) 20 MG tablet, TAKE 1 TABLET BY MOUTH ONCE DAILY, Disp: 30 tablet, Rfl: 11 .  lovastatin (MEVACOR) 20 MG tablet, TAKE 1 TABLET BY MOUTH DAILY, Disp: 30 tablet, Rfl: 12 .  buPROPion (WELLBUTRIN SR) 150 MG 12 hr tablet, TAKE 1 TABLET BY MOUTH TWICE DAILY (Patient not taking: Reported on 05/15/2019), Disp:  60 tablet, Rfl: 6 .  naproxen (NAPROSYN) 500 MG tablet, Take 1 tablet (500 mg total) by mouth 2 (two) times daily with a meal. (Patient not taking: Reported on 05/15/2019), Disp: 20 tablet, Rfl: 0 .  ondansetron (ZOFRAN) 4 MG tablet, Take 1 tablet (4 mg total) by mouth every 8 (eight) hours as needed for nausea or vomiting. (Patient not taking: Reported on 05/15/2019), Disp: 20 tablet, Rfl: 0  Review of Systems  Constitutional: Positive for appetite change, chills and fatigue. Negative for fever.  HENT: Negative.   Respiratory: Negative for chest tightness and shortness of breath.   Cardiovascular: Negative for chest pain and palpitations.  Gastrointestinal: Positive for abdominal pain, nausea and vomiting. Negative for anal bleeding, blood in stool, constipation, diarrhea and rectal pain.  Neurological: Negative for dizziness, weakness and headaches.    Social History   Tobacco Use  . Smoking status: Current Every Day Smoker    Packs/day: 0.50    Years: 30.00    Pack years: 15.00    Types: Cigarettes  . Smokeless tobacco: Never Used  Substance Use Topics  . Alcohol use: Yes    Alcohol/week: 0.0 standard drinks    Comment: once a month      Objective:   Temp (!) 97.5 F (36.4 C) (Oral)  Vitals:   05/15/19 0844  Temp: (!) 97.5 F (36.4 C)  TempSrc: Oral     Physical Exam Constitutional:      General: She  is not in acute distress. Pulmonary:     Effort: No respiratory distress.  Neurological:     Mental Status: She is alert.      No results found for any visits on 05/15/19.     Assessment & Plan    1. Gastroenteritis Suspect gastroenteritis. Will treat with Zofran as below. Push fluids. Increase diet as tolerated. Call if worsening or unable to keep fluids/food down.  - ondansetron (ZOFRAN ODT) 4 MG disintegrating tablet; Take 1 tablet (4 mg total) by mouth every 8 (eight) hours as needed for nausea or vomiting.  Dispense: 20 tablet; Refill: 0  I discussed the  assessment and treatment plan with the patient. The patient was provided an opportunity to ask questions and all were answered. The patient agreed with the plan and demonstrated an understanding of the instructions.   The patient was advised to call back or seek an in-person evaluation if the symptoms worsen or if the condition fails to improve as anticipated.  I provided 13 minutes of non-face-to-face time during this encounter.       Margaretann LovelessJennifer M , PA-C  [facility]

## 2019-07-02 ENCOUNTER — Other Ambulatory Visit: Payer: Self-pay | Admitting: Family Medicine

## 2019-09-24 ENCOUNTER — Other Ambulatory Visit: Payer: Self-pay | Admitting: Family Medicine

## 2019-09-27 ENCOUNTER — Other Ambulatory Visit: Payer: Self-pay | Admitting: Family Medicine

## 2019-09-27 DIAGNOSIS — F32A Depression, unspecified: Secondary | ICD-10-CM

## 2019-09-27 DIAGNOSIS — F329 Major depressive disorder, single episode, unspecified: Secondary | ICD-10-CM

## 2019-09-28 NOTE — Telephone Encounter (Signed)
Requested medication (s) are due for refill today: yes  Requested medication (s) are on the active medication list: yes  Last refill:  08/30/2019  Future visit scheduled: no  Notes to clinic:   This refill cannot be delegated  Requested Prescriptions  Pending Prescriptions Disp Refills   clonazePAM (KLONOPIN) 0.5 MG tablet [Pharmacy Med Name: CLONAZEPAM 0.5MG  TABLETS] 30 tablet     Sig: TAKE 1 TABLET BY MOUTH EVERY NIGHT AT BEDTIME      Not Delegated - Psychiatry:  Anxiolytics/Hypnotics Failed - 09/27/2019  8:41 PM      Failed - This refill cannot be delegated      Failed - Urine Drug Screen completed in last 360 days.      Passed - Valid encounter within last 6 months    Recent Outpatient Visits           4 months ago Gastroenteritis   [facility] Joycelyn Man [place], [place]   11 months ago Callus of foot   [facility] Malva Limes, MD   1 year ago Strain of lumbar region, initial encounter   [facility] Trey Sailors, [place]   1 year ago Dysuria   [facility] Malva Limes, MD   1 year ago Primary insomnia   [facility] Malva Limes, MD

## 2019-11-24 ENCOUNTER — Other Ambulatory Visit: Payer: Self-pay | Admitting: Family Medicine

## 2019-11-24 NOTE — Telephone Encounter (Signed)
Refill request for ALPRAZOLAM 0.5MG  TABLETS.

## 2020-01-25 ENCOUNTER — Other Ambulatory Visit: Payer: Self-pay | Admitting: Family Medicine

## 2020-01-25 DIAGNOSIS — F419 Anxiety disorder, unspecified: Secondary | ICD-10-CM

## 2020-01-25 DIAGNOSIS — F32A Depression, unspecified: Secondary | ICD-10-CM

## 2020-01-25 DIAGNOSIS — F329 Major depressive disorder, single episode, unspecified: Secondary | ICD-10-CM

## 2020-01-25 NOTE — Telephone Encounter (Signed)
Requested  medications are  due for refill today yes  Requested medications are on the active medication list yes  Last refill 4/5  Future visit scheduled no  Last visit 1 year ago  Notes to clinic Not Delegated

## 2020-03-01 ENCOUNTER — Other Ambulatory Visit: Payer: Self-pay | Admitting: Family Medicine

## 2020-03-01 DIAGNOSIS — F419 Anxiety disorder, unspecified: Secondary | ICD-10-CM

## 2020-03-30 ENCOUNTER — Other Ambulatory Visit: Payer: Self-pay | Admitting: Family Medicine

## 2020-03-30 NOTE — Telephone Encounter (Signed)
Requested medication (s) are due for refill today: yes  Requested medication (s) are on the active medication list: yes  Last refill:  11/24/19  Future visit scheduled: no  Notes to clinic:  med not delegated to NT to RF   Requested Prescriptions  Pending Prescriptions Disp Refills   ALPRAZolam (XANAX) 0.5 MG tablet [Pharmacy Med Name: ALPRAZOLAM 0.5MG  TABLETS] 90 tablet     Sig: TAKE 1 TABLET(0.5 MG) BY MOUTH EVERY 6 HOURS AS NEEDED      Not Delegated - Psychiatry:  Anxiolytics/Hypnotics Failed - 03/30/2020 12:18 PM      Failed - This refill cannot be delegated      Failed - Urine Drug Screen completed in last 360 days.      Failed - Valid encounter within last 6 months    Recent Outpatient Visits           10 months ago Gastroenteritis   [facility] [place], [name], [place]   1 year ago Callus of foot   [facility] Malva Limes, MD   1 year ago Strain of lumbar region, initial encounter   [facility] Trey [place]ailors, [place]   1 year ago Dysuria   [facility] Malva Limes, MD   1 year ago Primary insomnia   [facility] Malva Limes, MD

## 2020-04-25 ENCOUNTER — Other Ambulatory Visit: Payer: Self-pay | Admitting: Family Medicine

## 2020-04-25 DIAGNOSIS — F419 Anxiety disorder, unspecified: Secondary | ICD-10-CM

## 2020-04-25 NOTE — Telephone Encounter (Signed)
Requested medication (s) are due for refill today: Yes  Requested medication (s) are on the active medication list: Yes  Last refill:  Klonopin  03/02/20  Xanax  04/01/20  Future visit scheduled: No  Notes to clinic:  See request.    Requested Prescriptions  Pending Prescriptions Disp Refills   clonazePAM (KLONOPIN) 0.5 MG tablet [Pharmacy Med Name: CLONAZEPAM 0.5MG  TABLETS] 30 tablet     Sig: TAKE 1 TABLET BY MOUTH EVERY NIGHT AT BEDTIME      Not Delegated - Psychiatry:  Anxiolytics/Hypnotics Failed - 04/25/2020  2:51 PM      Failed - This refill cannot be delegated      Failed - Urine Drug Screen completed in last 360 days.      Failed - Valid encounter within last 6 months    Recent Outpatient Visits           11 months ago Gastroenteritis   [facility] [place], [name], [place]   1 year ago Callus of foot   [facility] Malva Limes, MD   1 year ago Strain of lumbar region, initial encounter   [facility] Trey [place], [place]   1 year ago Dysuria   [facility] Malva Limes, MD   1 year ago Primary insomnia   [facility] Sherrie Mustache, Demetrios Isaacs, MD                ALPRAZolam Prudy Feeler) 0.5 MG tablet [Pharmacy Med Name: ALPRAZOLAM 0.5MG  TABLETS] 90 tablet     Sig: TAKE 1 TABLET(0.5 MG) BY MOUTH EVERY 6 HOURS AS NEEDED      Not Delegated - Psychiatry:  Anxiolytics/Hypnotics Failed - 04/25/2020  2:51 PM      Failed - This refill cannot be delegated      Failed - Urine Drug Screen completed in last 360 days.      Failed - Valid encounter within last 6 months    Recent Outpatient Visits           11 months ago Gastroenteritis   [facility] [place], [name], [place]   1 year ago Callus of foot   [facility] Malva Limes, MD   1 year ago Strain of lumbar region, initial encounter   [facility] Trey [place], [place]   1 year ago Dysuria    [facility] Malva Limes, MD   1 year ago Primary insomnia   [facility] Malva Limes, MD

## 2020-08-13 ENCOUNTER — Other Ambulatory Visit: Payer: Self-pay | Admitting: Family Medicine

## 2020-08-13 DIAGNOSIS — Z1231 Encounter for screening mammogram for malignant neoplasm of breast: Secondary | ICD-10-CM

## 2020-08-26 ENCOUNTER — Ambulatory Visit (INDEPENDENT_AMBULATORY_CARE_PROVIDER_SITE_OTHER): Payer: Federal, State, Local not specified - PPO

## 2020-08-26 ENCOUNTER — Encounter: Payer: Self-pay | Admitting: *Deleted

## 2020-08-26 ENCOUNTER — Ambulatory Visit: Payer: Federal, State, Local not specified - PPO | Admitting: Podiatry

## 2020-08-26 ENCOUNTER — Other Ambulatory Visit: Payer: Self-pay

## 2020-08-26 ENCOUNTER — Encounter: Payer: Self-pay | Admitting: Podiatry

## 2020-08-26 DIAGNOSIS — M2041 Other hammer toe(s) (acquired), right foot: Secondary | ICD-10-CM | POA: Diagnosis not present

## 2020-08-26 DIAGNOSIS — M2011 Hallux valgus (acquired), right foot: Secondary | ICD-10-CM

## 2020-08-26 DIAGNOSIS — M898X7 Other specified disorders of bone, ankle and foot: Secondary | ICD-10-CM

## 2020-08-26 DIAGNOSIS — Z9889 Other specified postprocedural states: Secondary | ICD-10-CM

## 2020-08-26 DIAGNOSIS — G5751 Tarsal tunnel syndrome, right lower limb: Secondary | ICD-10-CM

## 2020-08-26 MED ORDER — GABAPENTIN 100 MG PO CAPS: 100.0000 mg | ORAL_CAPSULE | Freq: Three times a day (TID) | ORAL | 3 refills | Status: DC

## 2020-08-26 NOTE — Progress Notes (Signed)
She presents today after having not seen her for little more than a year and a half.  She states that previous surgery on the right foot really never completely resolved.  She states that she is always had some pain and has increased over the past several months.  She states that she feels lumps on the plantar aspect of the foot she has numbness and tingling in the fifth toe and across the dorsal aspect extending to the toes 3 and 4 the right foot.  Objective: Vital signs are stable she alert oriented x3.  There is no erythema edema cellulitis drainage or odor at all.  She has great range of motion of the first metatarsophalangeal joint and is nonsymptomatic.  She has some numbness and tingling palpation of the lesser toes.  The majority of her pain is associated at the level of the plantar first metatarsal just proximal to the sesamoids with severe pain on palpation of the medial band of the plantar fascia or the medial plantar nerve.  I am unable to feel any plantar fibromas or nodules on the tendon or even feel the nodules that she is asking me to feel.  Radiographs taken today demonstrate a well-healed surgical foot right.  Internal fixation is in good position  Assessment: Cannot rule out CRPS cannot rule out a neuropathy or a radiculopathy.  Plan: At this point I am going to start her gabapentin 300 mg 1 p.o. nightly.  I am also going to request a EMG and nerve conduction velocity exam.

## 2020-08-29 ENCOUNTER — Other Ambulatory Visit: Payer: Self-pay | Admitting: Family Medicine

## 2020-08-29 DIAGNOSIS — F419 Anxiety disorder, unspecified: Secondary | ICD-10-CM

## 2020-08-29 NOTE — Telephone Encounter (Signed)
Requested medication (s) are due for refill today: yes  Requested medication (s) are on the active medication list: yes  Last refill:  Xanax-04/26/20 #90 with 3 refills, Clonazepam 04/26/20 #30 with 3 refills  Future visit scheduled: no  Notes to clinic:  Please review for refill. Not delegated per protocol    Requested Prescriptions  Pending Prescriptions Disp Refills   ALPRAZolam (XANAX) 0.5 MG tablet [Pharmacy Med Name: ALPRAZOLAM 0.5MG  TABLETS] 90 tablet     Sig: TAKE 1 TABLET(0.5 MG) BY MOUTH EVERY 6 HOURS AS NEEDED      Not Delegated - Psychiatry:  Anxiolytics/Hypnotics Failed - 08/29/2020  4:30 PM      Failed - This refill cannot be delegated      Failed - Urine Drug Screen completed in last 360 days      Failed - Valid encounter within last 6 months    Recent Outpatient Visits           1 year ago Gastroenteritis   [facility] [place], [name], [place]   1 year ago Callus of foot   [facility] Malva Limes, MD   2 years ago Strain of lumbar region, initial encounter   [facility] Osvaldo Angst [place], [place]   2 years ago Dysuria   [facility] Malva Limes, MD   2 years ago Primary insomnia   [facility] Malva Limes, MD                  clonazePAM (KLONOPIN) 0.5 MG tablet [Pharmacy Med Name: CLONAZEPAM 0.5MG  TABLETS] 30 tablet     Sig: TAKE 1 TABLET BY MOUTH EVERY NIGHT AT BEDTIME      Not Delegated - Psychiatry:  Anxiolytics/Hypnotics Failed - 08/29/2020  4:30 PM      Failed - This refill cannot be delegated      Failed - Urine Drug Screen completed in last 360 days      Failed - Valid encounter within last 6 months    Recent Outpatient Visits           1 year ago Gastroenteritis   [facility] [place], [name], [place]   1 year ago Callus of foot   [facility] Malva Limes, MD   2 years ago Strain of lumbar region, initial encounter    [facility] [place], [place], [place]   2 years ago Dysuria   [facility] Malva Limes, MD   2 years ago Primary insomnia   [facility] Malva Limes, MD

## 2020-08-30 ENCOUNTER — Other Ambulatory Visit: Payer: Self-pay

## 2020-08-30 ENCOUNTER — Encounter: Payer: Self-pay | Admitting: Neurology

## 2020-08-30 DIAGNOSIS — R202 Paresthesia of skin: Secondary | ICD-10-CM

## 2020-08-30 NOTE — Addendum Note (Signed)
Addended by: Karl Luke A on: 08/30/2020 04:24 PM   Modules accepted: Orders

## 2020-09-30 ENCOUNTER — Telehealth: Payer: Self-pay

## 2020-09-30 NOTE — Telephone Encounter (Signed)
Patient called stated that she took the Gabapentin 100mg  at bedtime for 1 week and it mad her feel loopy, dizzy, nauseated and did not help with the pain.  She has not taken any more and wanted to know if you could give her a cortisone injection or give another medication to help with her pain.  Her NCS is schedule 11/12/20  Please advise

## 2020-10-01 DIAGNOSIS — J069 Acute upper respiratory infection, unspecified: Secondary | ICD-10-CM | POA: Diagnosis not present

## 2020-10-01 DIAGNOSIS — Z20822 Contact with and (suspected) exposure to covid-19: Secondary | ICD-10-CM | POA: Diagnosis not present

## 2020-10-02 ENCOUNTER — Encounter: Payer: Federal, State, Local not specified - PPO | Admitting: Neurology

## 2020-10-08 ENCOUNTER — Other Ambulatory Visit: Payer: Self-pay | Admitting: Family Medicine

## 2020-10-08 DIAGNOSIS — E7800 Pure hypercholesterolemia, unspecified: Secondary | ICD-10-CM

## 2020-10-09 NOTE — Telephone Encounter (Signed)
Patient is overdue for follow up appointment and labs. Patient advised. Appointment scheduled for 10/23/2020 at 9:20am. Courtesy refill sent into pharmacy.

## 2020-10-15 ENCOUNTER — Encounter: Payer: Federal, State, Local not specified - PPO | Admitting: Neurology

## 2020-10-21 DIAGNOSIS — R6889 Other general symptoms and signs: Secondary | ICD-10-CM | POA: Diagnosis not present

## 2020-10-21 DIAGNOSIS — Z20828 Contact with and (suspected) exposure to other viral communicable diseases: Secondary | ICD-10-CM | POA: Diagnosis not present

## 2020-10-21 DIAGNOSIS — J0110 Acute frontal sinusitis, unspecified: Secondary | ICD-10-CM | POA: Diagnosis not present

## 2020-10-21 DIAGNOSIS — U071 COVID-19: Secondary | ICD-10-CM | POA: Diagnosis not present

## 2020-10-23 ENCOUNTER — Ambulatory Visit: Payer: Self-pay | Admitting: Family Medicine

## 2020-10-30 ENCOUNTER — Other Ambulatory Visit: Payer: Self-pay | Admitting: Family Medicine

## 2020-10-30 DIAGNOSIS — F419 Anxiety disorder, unspecified: Secondary | ICD-10-CM

## 2020-11-08 ENCOUNTER — Ambulatory Visit: Payer: Federal, State, Local not specified - PPO | Admitting: Family Medicine

## 2020-11-08 ENCOUNTER — Encounter: Payer: Self-pay | Admitting: Family Medicine

## 2020-11-08 VITALS — BP 117/82 | HR 70 | Resp 16 | Ht 63.75 in | Wt 117.4 lb

## 2020-11-08 DIAGNOSIS — F5101 Primary insomnia: Secondary | ICD-10-CM

## 2020-11-08 DIAGNOSIS — E7800 Pure hypercholesterolemia, unspecified: Secondary | ICD-10-CM | POA: Diagnosis not present

## 2020-11-08 DIAGNOSIS — F419 Anxiety disorder, unspecified: Secondary | ICD-10-CM | POA: Diagnosis not present

## 2020-11-08 DIAGNOSIS — F17210 Nicotine dependence, cigarettes, uncomplicated: Secondary | ICD-10-CM

## 2020-11-08 DIAGNOSIS — E041 Nontoxic single thyroid nodule: Secondary | ICD-10-CM

## 2020-11-08 NOTE — Progress Notes (Signed)
Established patient visit   Patient: Kristen Wright   DOB: 05/24/1961   60 y.o. Female  MRN: [id number] Visit Date: 11/08/2020  Today's healthcare provider: Mila Merry, MD   Chief Complaint  Patient presents with  . Hyperlipidemia  . Anxiety   Subjective    HPI  Lipid/Cholesterol, Follow-up  Last lipid panel Other pertinent labs  Lab Results  Component Value Date   CHOL 177 06/10/2018   HDL 71 06/10/2018   LDLCALC 94 06/10/2018   TRIG 60 06/10/2018   CHOLHDL 2.5 06/10/2018   Lab Results  Component Value Date   ALT 13 06/10/2018   AST 19 06/10/2018   PLT 240 09/28/2016   TSH 1.170 09/28/2016     She was last seen for this more than 2 years ago.  Management since that visit includes continuing same medications.  She reports good compliance with treatment. She is not having side effects.   Symptoms: No chest pain No chest pressure/discomfort  No dyspnea No lower extremity edema  No numbness or tingling of extremity No orthopnea  No palpitations No paroxysmal nocturnal dyspnea  No speech difficulty No syncope   Current diet: in general, a "healthy" diet   Current exercise: none  The 10-year ASCVD risk score Denman George DC Jr., et al., 2013) is: 3.4%  ---------------------------------------------------------------------------------------------------  Anxiety, Follow-up  She was last seen for anxiety more than 1 year ago.   Changes made at last visit include none.   She reports good compliance with treatment. She reports good tolerance of treatment. She is not having side effects.   She feels her anxiety is moderate and Improved since last visit.  Symptoms: No chest pain No difficulty concentrating  No dizziness No fatigue  No feelings of losing control Yes insomnia  No irritable No palpitations  Yes panic attacks No racing thoughts  No shortness of breath No sweating  No tremors/shakes    GAD-7 Results No flowsheet data found.  PHQ-9  Scores PHQ9 SCORE ONLY 11/08/2020 01/06/2018  PHQ-9 Total Score 10 16    ---------------------------------------------------------------------------------------------------     Medications: Outpatient Medications Prior to Visit  Medication Sig  . ALPRAZolam (XANAX) 0.5 MG tablet Take 1 tablet (0.5 mg total) by mouth every 6 (six) hours as needed for anxiety.  . clonazePAM (KLONOPIN) 0.5 MG tablet Take 1 tablet (0.5 mg total) by mouth at bedtime.  . lovastatin (MEVACOR) 20 MG tablet TAKE 1 TABLET BY MOUTH DAILY  . [DISCONTINUED] gabapentin (NEURONTIN) 100 MG capsule Take 1 capsule (100 mg total) by mouth 3 (three) times daily. (Patient not taking: Reported on 11/08/2020)   No facility-administered medications prior to visit.    Review of Systems  Constitutional: Negative for appetite change, chills, fatigue and fever.  Respiratory: Negative for chest tightness and shortness of breath.   Cardiovascular: Negative for chest pain and palpitations.  Gastrointestinal: Negative for abdominal pain, nausea and vomiting.  Neurological: Negative for dizziness and weakness.       Objective    BP 117/82 (BP Location: Right Arm, Patient Position: Sitting, Cuff Size: Normal)   Pulse 70   Resp 16   Ht 5' 3.75" (1.619 m)   Wt 117 lb 6.4 oz (53.3 kg)   BMI 20.31 kg/m     Physical Exam    General: Appearance:    Well developed, well nourished female in no acute distress  Eyes:    PERRL, conjunctiva/corneas clear, EOM's intact  Lungs:     Clear to auscultation bilaterally, respirations unlabored  Heart:    Normal heart rate. Normal rhythm. No murmurs, rubs, or gallops.   MS:   All extremities are intact.   Neurologic:   Awake, alert, oriented x 3. No apparent focal neurological           defect.         Assessment & Plan     1. Anxiety Doing well on current medication regiment.   2. Pure hypercholesterolemia She is tolerating lovastatin well with no adverse effects.   - CBC -  Lipid panel - Comprehensive metabolic panel - TSH  3. Thyroid nodule  - TSH - T4, free  4. Primary insomnia Fairly well managed with hs clonazepam, feels well rested during the day.   5. Smoking greater than 30 pack years  - Ambulatory Referral for Lung Cancer Scre    She declined recommended vaccine today.      The entirety of the information documented in the History of Present Illness, Review of Systems and Physical Exam were personally obtained by me. Portions of this information were initially documented by the CMA and reviewed by me for thoroughness and accuracy.      Mila Merry, MD  [facility] [phone] (phone) [phone] (fax)  [facility]

## 2020-11-08 NOTE — Patient Instructions (Addendum)
  Please call the [facility] at [phone] to schedule your mammogram.  I recommend getting at least one dose of the Covid vaccine about 3 months after recovering from the Covid infection

## 2020-11-09 LAB — COMPREHENSIVE METABOLIC PANEL
ALT: 11 IU/L (ref 0–32)
AST: 14 IU/L (ref 0–40)
Albumin/Globulin Ratio: 1.9 (ref 1.2–2.2)
Albumin: 4.5 g/dL (ref 3.8–4.9)
Alkaline Phosphatase: 79 IU/L (ref 44–121)
BUN/Creatinine Ratio: 10 (ref 9–23)
BUN: 8 mg/dL (ref 6–24)
Bilirubin Total: 0.4 mg/dL (ref 0.0–1.2)
CO2: 24 mmol/L (ref 20–29)
Calcium: 9.4 mg/dL (ref 8.7–10.2)
Chloride: 102 mmol/L (ref 96–106)
Creatinine, Ser: 0.83 mg/dL (ref 0.57–1.00)
GFR calc Af Amer: 91 mL/min/{1.73_m2} (ref >59)
GFR calc non Af Amer: 79 mL/min/{1.73_m2} (ref >59)
Globulin, Total: 2.4 g/dL (ref 1.5–4.5)
Glucose: 83 mg/dL (ref 65–99)
Potassium: 4.3 mmol/L (ref 3.5–5.2)
Sodium: 141 mmol/L (ref 134–144)
Total Protein: 6.9 g/dL (ref 6.0–8.5)

## 2020-11-09 LAB — CBC
Hematocrit: 39.6 % (ref 34.0–46.6)
Hemoglobin: 13.4 g/dL (ref 11.1–15.9)
MCH: 30.5 pg (ref 26.6–33.0)
MCHC: 33.8 g/dL (ref 31.5–35.7)
MCV: 90 fL (ref 79–97)
Platelets: 273 10*3/uL (ref 150–450)
RBC: 4.39 x10E6/uL (ref 3.77–5.28)
RDW: 12.2 % (ref 11.7–15.4)
WBC: 5.5 10*3/uL (ref 3.4–10.8)

## 2020-11-09 LAB — LIPID PANEL
Chol/HDL Ratio: 2.6 ratio (ref 0.0–4.4)
Cholesterol, Total: 178 mg/dL (ref 100–199)
HDL: 69 mg/dL (ref >39)
LDL Chol Calc (NIH): 100 mg/dL — ABNORMAL HIGH (ref 0–99)
Triglycerides: 46 mg/dL (ref 0–149)
VLDL Cholesterol Cal: 9 mg/dL (ref 5–40)

## 2020-11-09 LAB — TSH: TSH: 0.78 u[IU]/mL (ref 0.450–4.500)

## 2020-11-09 LAB — T4, FREE: Free T4: 1.11 ng/dL (ref 0.82–1.77)

## 2020-11-11 ENCOUNTER — Telehealth: Payer: Self-pay

## 2020-11-11 NOTE — Telephone Encounter (Signed)
-----   Message from Malva Limes, MD sent at 11/11/2020  7:54 AM EST ----- Labs al very good. Continue current medications.

## 2020-11-11 NOTE — Telephone Encounter (Signed)
Pt called and verbalized understanding of information below. 

## 2020-11-12 ENCOUNTER — Encounter: Payer: Self-pay | Admitting: Neurology

## 2020-11-12 ENCOUNTER — Other Ambulatory Visit: Payer: Self-pay

## 2020-11-12 ENCOUNTER — Ambulatory Visit: Payer: Federal, State, Local not specified - PPO | Admitting: Neurology

## 2020-11-12 DIAGNOSIS — R202 Paresthesia of skin: Secondary | ICD-10-CM | POA: Diagnosis not present

## 2020-11-12 NOTE — Procedures (Signed)
[facility]  [address], [address]  [place], [place] [address] Tel: [phone] Fax:  [phone] Test Date:  11/12/2020  Patient: Kristen Wright DOB: 1960/12/07 Physician: Nita Sickle, DO  Sex: Female Height: 5\' 3"  Ref Phys: Max T. Trivoli, [place]  ID#: [id number]   Technician:    Patient Complaints: This is a 60 year old female referred for evaluation of right foot pain following total surgery.  NCV & EMG Findings: Extensive electrodiagnostic testing of the right lower extremity shows: 1. Right sural and superficial peroneal sensory responses are within normal limits 2. Right peroneal and tibial motor responses are within normal limits. 3. Right tibial H reflex study is within normal limits. 4. There is no evidence of active or chronic motor axonal loss changes affecting any of the tested muscles.  Motor unit configuration and recruitment pattern is within normal limits.   Impression: This is a normal study of the right lower extremity.  In particular, there is no evidence of a sensorimotor polyneuropathy or lumbosacral radiculopathy.   ___________________________ 60, DO    Nerve Conduction Studies Anti Sensory Summary Table   Stim Site NR Peak (ms) Norm Peak (ms) P-T Amp (V) Norm P-T Amp  Right Sup Peroneal Anti Sensory (Ant Lat Mall)  34C  12 cm    2.1 <4.6 12.0 >4  Right Sural Anti Sensory (Lat Mall)  34C  Calf    3.4 <4.6 17.4 >4   Motor Summary Table   Stim Site NR Onset (ms) Norm Onset (ms) O-P Amp (mV) Norm O-P Amp Site1 Site2 Delta-0 (ms) Dist (cm) Vel (m/s) Norm Vel (m/s)  Right Peroneal Motor (Ext Dig Brev)  34C  Ankle    2.8 <6.0 3.8 >2.5 B Fib Ankle 7.3 34.0 47 >40  B Fib    10.1  3.2  Poplt B Fib 1.4 8.0 57 >40  Poplt    11.5  3.0         Right Tibial Motor (Abd Hall Brev)  34C  Ankle    3.0 <6.0 9.0 >4 Knee Ankle 7.9 41.0 52 >40  Knee    10.9  5.9          H Reflex Studies   NR H-Lat (ms) Lat Norm (ms) L-R H-Lat (ms)   Right Tibial (Gastroc)  34C     33.74 <35    EMG   Side Muscle Ins Act Fibs Psw Fasc Number Recrt Dur Dur. Amp Amp. Poly Poly. Comment  Right AntTibialis Nml Nml Nml Nml Nml Nml Nml Nml Nml Nml Nml Nml N/A  Right Gastroc Nml Nml Nml Nml Nml Nml Nml Nml Nml Nml Nml Nml N/A  Right Flex Dig Long Nml Nml Nml Nml Nml Nml Nml Nml Nml Nml Nml Nml N/A  Right AbdHallucis Nml Nml Nml Nml Nml Nml Nml Nml Nml Nml Nml Nml N/A  Right RectFemoris Nml Nml Nml Nml Nml Nml Nml Nml Nml Nml Nml Nml N/A  Right BicepsFemS Nml Nml Nml Nml Nml Nml Nml Nml Nml Nml Nml Nml N/A      Waveforms:

## 2020-11-18 ENCOUNTER — Telehealth: Payer: Self-pay | Admitting: *Deleted

## 2020-11-18 DIAGNOSIS — Z87891 Personal history of nicotine dependence: Secondary | ICD-10-CM

## 2020-11-18 DIAGNOSIS — F17200 Nicotine dependence, unspecified, uncomplicated: Secondary | ICD-10-CM

## 2020-11-18 DIAGNOSIS — Z122 Encounter for screening for malignant neoplasm of respiratory organs: Secondary | ICD-10-CM

## 2020-11-18 NOTE — Telephone Encounter (Signed)
Received referral for low dose lung cancer screening CT scan. Message left at phone number listed in EMR for patient to call me back to facilitate scheduling scan.  

## 2020-11-19 NOTE — Addendum Note (Signed)
Addended by: Jonne Ply on: 11/19/2020 02:31 PM   Modules accepted: Orders

## 2020-11-19 NOTE — Telephone Encounter (Signed)
Received referral for initial lung cancer screening scan. Contacted patient and obtained smoking history,(current, 44 pack year) as well as answering questions related to screening process. Patient denies signs of lung cancer such as weight loss or hemoptysis. Patient denies comorbidity that would prevent curative treatment if lung cancer were found. Patient is scheduled for shared decision making visit and CT scan on 12/04/20 at 1015am.

## 2020-11-26 ENCOUNTER — Other Ambulatory Visit: Payer: Self-pay | Admitting: Family Medicine

## 2020-11-26 DIAGNOSIS — F419 Anxiety disorder, unspecified: Secondary | ICD-10-CM

## 2020-11-26 NOTE — Telephone Encounter (Signed)
Requested medications are due for refill today.  yes  Requested medications are on the active medications list.  yes  Last refill. 10/30/2020, 10/30/2020  Future visit scheduled.   no  Notes to clinic.  Not delegated

## 2020-12-04 ENCOUNTER — Inpatient Hospital Stay: Payer: Federal, State, Local not specified - PPO | Attending: Oncology | Admitting: Oncology

## 2020-12-04 ENCOUNTER — Other Ambulatory Visit: Payer: Self-pay | Admitting: Family Medicine

## 2020-12-04 ENCOUNTER — Other Ambulatory Visit: Payer: Self-pay

## 2020-12-04 ENCOUNTER — Ambulatory Visit
Admission: RE | Admit: 2020-12-04 | Discharge: 2020-12-04 | Disposition: A | Discharge status: Home / Self Care | Payer: Federal, State, Local not specified - PPO | Source: Ambulatory Visit | Attending: Oncology | Admitting: Oncology

## 2020-12-04 ENCOUNTER — Encounter: Payer: Self-pay | Admitting: Oncology

## 2020-12-04 DIAGNOSIS — E7800 Pure hypercholesterolemia, unspecified: Secondary | ICD-10-CM

## 2020-12-04 DIAGNOSIS — Z87891 Personal history of nicotine dependence: Secondary | ICD-10-CM | POA: Insufficient documentation

## 2020-12-04 DIAGNOSIS — Z122 Encounter for screening for malignant neoplasm of respiratory organs: Secondary | ICD-10-CM

## 2020-12-04 DIAGNOSIS — F17210 Nicotine dependence, cigarettes, uncomplicated: Secondary | ICD-10-CM | POA: Diagnosis not present

## 2020-12-04 DIAGNOSIS — F17200 Nicotine dependence, unspecified, uncomplicated: Secondary | ICD-10-CM

## 2020-12-04 NOTE — Telephone Encounter (Signed)
Requested medication (s) are due for refill today:   Yes  Dr Sherrie Mustache approved 30 days 0 refills this morning 3/16  Requested medication (s) are on the active medication list:   Yes  Future visit scheduled:   No   Last ordered: 12/04/2020 #30, 0 refills (today)  Clinic note:   Returned because pharmacy requesting a 90 day supply.   Requested Prescriptions  Pending Prescriptions Disp Refills   lovastatin (MEVACOR) 20 MG tablet [Pharmacy Med Name: LOVASTATIN 20MG  TABLETS] 90 tablet     Sig: TAKE 1 TABLET BY MOUTH DAILY      Cardiovascular:  Antilipid - Statins Failed - 12/04/2020  8:22 AM      Failed - LDL in normal range and within 360 days    LDL Chol Calc (NIH)  Date Value Ref Range Status  11/08/2020 100 (H) 0 - 99 mg/dL Final          Passed - Total Cholesterol in normal range and within 360 days    Cholesterol, Total  Date Value Ref Range Status  11/08/2020 178 100 - 199 mg/dL Final          Passed - HDL in normal range and within 360 days    HDL  Date Value Ref Range Status  11/08/2020 69 >39 mg/dL Final          Passed - Triglycerides in normal range and within 360 days    Triglycerides  Date Value Ref Range Status  11/08/2020 46 0 - 149 mg/dL Final          Passed - Patient is not pregnant      Passed - Valid encounter within last 12 months    Recent Outpatient Visits           3 weeks ago Anxiety   [name], [place]   1 year ago Gastroenteritis   [facility] [place], [name], [name]   2 years ago Callus of foot   [facility], [place]   2 years ago Strain of lumbar region, initial encounter   [facility] [place], [place], Lavella Hammock   2 years ago Dysuria   [facility] [place], [facility], [place]       Future Appointments             Today Demetrios Isaacs, Lawerance Bach, NP [facility]

## 2020-12-04 NOTE — Progress Notes (Signed)
Virtual Visit via Video Note  I connected with Kristen Wright on 12/04/20 at 10:15 AM EDT by a video enabled telemedicine application and verified that I am speaking with the correct person using two identifiers.  Location: Patient: [facility] Provider: Clinic    I discussed the limitations of evaluation and management by telemedicine and the availability of in person appointments. The patient expressed understanding and agreed to proceed.  I discussed the assessment and treatment plan with the patient. The patient was provided an opportunity to ask questions and all were answered. The patient agreed with the plan and demonstrated an understanding of the instructions.   The patient was advised to call back or seek an in-person evaluation if the symptoms worsen or if the condition fails to improve as anticipated.   In accordance with CMS guidelines, patient has met eligibility criteria including age, absence of signs or symptoms of lung cancer.  Social History   Tobacco Use  . Smoking status: Current Every Day Smoker    Packs/day: 1.00    Years: 44.00    Pack years: 44.00    Types: Cigarettes  . Smokeless tobacco: Never Used  Substance Use Topics  . Alcohol use: Yes    Alcohol/week: 0.0 standard drinks    Comment: once a month  . Drug use: No      A shared decision-making session was conducted prior to the performance of CT scan. This includes one or more decision aids, includes benefits and harms of screening, follow-up diagnostic testing, over-diagnosis, false positive rate, and total radiation exposure.   Counseling on the importance of adherence to annual lung cancer LDCT screening, impact of co-morbidities, and ability or willingness to undergo diagnosis and treatment is imperative for compliance of the program.   Counseling on the importance of continued smoking cessation for former smokers; the importance of smoking cessation for current smokers, and information about tobacco  cessation interventions have been given to patient including [place] and [place] quit [place] programs.   Written order for lung cancer screening with LDCT has been given to the patient and any and all questions have been answered to the best of my abilities.    Yearly follow up will be coordinated by Burgess Estelle, Thoracic Navigator.  I provided 15 minutes of face-to-face video visit time during this encounter, and > 50% was spent counseling as documented under my assessment & plan.   Jacquelin Hawking, NP

## 2020-12-06 ENCOUNTER — Telehealth: Payer: Self-pay | Admitting: *Deleted

## 2020-12-06 NOTE — Telephone Encounter (Signed)
Notified patient of LDCT lung cancer screening program results with recommendation for 12 month follow up imaging. Also notified of incidental findings noted below and is encouraged to discuss further with PCP who will receive a copy of this note and/or the CT report. Patient verbalizes understanding.   IMPRESSION: 1. Lung-RADS 1, negative. Continue annual screening with low-dose chest CT without contrast in 12 months. 2. Aortic Atherosclerosis (ICD10-I70.0) and Emphysema (ICD10-J43.9). 3. Age advanced coronary artery atherosclerosis. Recommend assessment of coronary risk factors and consideration of medical therapy.  

## 2020-12-16 ENCOUNTER — Encounter: Payer: Self-pay | Admitting: Podiatry

## 2020-12-16 ENCOUNTER — Other Ambulatory Visit: Payer: Self-pay

## 2020-12-16 ENCOUNTER — Encounter: Payer: Self-pay | Admitting: Family Medicine

## 2020-12-16 ENCOUNTER — Ambulatory Visit: Payer: Federal, State, Local not specified - PPO | Admitting: Podiatry

## 2020-12-16 DIAGNOSIS — T8460XA Infection and inflammatory reaction due to internal fixation device of unspecified site, initial encounter: Secondary | ICD-10-CM

## 2020-12-16 DIAGNOSIS — I700 Atherosclerosis of aorta: Secondary | ICD-10-CM | POA: Insufficient documentation

## 2020-12-16 DIAGNOSIS — I2510 Atherosclerotic heart disease of native coronary artery without angina pectoris: Secondary | ICD-10-CM | POA: Insufficient documentation

## 2020-12-16 NOTE — Progress Notes (Signed)
She presents today stating that the pain is the same or worse in the first metatarsal of the right foot.  States that she had a nerve conduction test done and it was negative.  Objective: Vital signs are stable alert oriented x3.  She has pain on direct palpation of the screw site.  She has great range of motion of the first metatarsophalangeal joint without any tenderness.  She does have some tenderness on palpation of the sesamoids.  Assessment: Painful internal fixation most likely in the event that there is negative nerve involvement.  Plan: At this point were going to consent her today for removal painful internal fixation screw first metatarsal right foot.  Answered all questions regarding his procedure best my ability layman's terms made sure that she understands that this is just to try to see if we can alleviate the symptoms.  By removing the screw.  Should this fail an MRI will be necessary of the first metatarsophalangeal joint.  She understands that and is amenable to it.  She already has a cam walker and a Darco shoe at home which she will bring with her on the day of surgery we will schedule this at the [facility] specialty surgical center in the near future.

## 2020-12-17 ENCOUNTER — Telehealth: Payer: Self-pay

## 2020-12-17 NOTE — Telephone Encounter (Signed)
DOS 12/20/2020  REMOVAL FIXATION DEEP 1ST RT - 20680  [employer] FEDERAL  EFFECTIVE DATE - 09/24/2007  PLAN DEDUCTIBLE - $350.00 W/ $0.00 REMAINING OUT OF POCKET - $6000.00 W/ $5543.00 REMAINING COPAY $0.00 COINSURANCE - 15%  NO AUTH REQUIRED

## 2020-12-18 ENCOUNTER — Other Ambulatory Visit: Payer: Self-pay | Admitting: Podiatry

## 2020-12-18 MED ORDER — HYDROCODONE-ACETAMINOPHEN 10-325 MG PO TABS: 1.0000 | ORAL_TABLET | Freq: Four times a day (QID) | ORAL | 0 refills | Status: AC | PRN

## 2020-12-18 MED ORDER — CEPHALEXIN 500 MG PO CAPS: 500.0000 mg | ORAL_CAPSULE | Freq: Three times a day (TID) | ORAL | 0 refills | Status: DC

## 2020-12-18 MED ORDER — ONDANSETRON HCL 4 MG PO TABS: 4.0000 mg | ORAL_TABLET | Freq: Three times a day (TID) | ORAL | 0 refills | Status: DC | PRN

## 2020-12-20 DIAGNOSIS — Z4889 Encounter for other specified surgical aftercare: Secondary | ICD-10-CM | POA: Diagnosis not present

## 2020-12-20 DIAGNOSIS — T8484XA Pain due to internal orthopedic prosthetic devices, implants and grafts, initial encounter: Secondary | ICD-10-CM | POA: Diagnosis not present

## 2020-12-25 ENCOUNTER — Ambulatory Visit (INDEPENDENT_AMBULATORY_CARE_PROVIDER_SITE_OTHER): Payer: Federal, State, Local not specified - PPO

## 2020-12-25 ENCOUNTER — Other Ambulatory Visit: Payer: Self-pay

## 2020-12-25 ENCOUNTER — Encounter: Payer: Self-pay | Admitting: Podiatry

## 2020-12-25 ENCOUNTER — Ambulatory Visit (INDEPENDENT_AMBULATORY_CARE_PROVIDER_SITE_OTHER): Payer: Federal, State, Local not specified - PPO | Admitting: Podiatry

## 2020-12-25 DIAGNOSIS — T8460XA Infection and inflammatory reaction due to internal fixation device of unspecified site, initial encounter: Secondary | ICD-10-CM

## 2020-12-25 DIAGNOSIS — Z9889 Other specified postprocedural states: Secondary | ICD-10-CM

## 2020-12-25 DIAGNOSIS — M79671 Pain in right foot: Secondary | ICD-10-CM | POA: Diagnosis not present

## 2020-12-25 NOTE — Progress Notes (Signed)
She presents today for follow-up of removal painful internal fixation first metatarsal right foot.  She states that I think that some of the pain has gone on no longer have the pain on the bottom of the foot and when I apply the ice it does not hurt like it used to through that screw.  So I think were getting better.  She denies fever chills nausea vomiting muscle aches and pains.  Objective: Dressed her dressing intact was removed demonstrates no erythema edema cellulitis drainage or odor forced simple skin stitches Prolene are intact no purulence no malodor no signs of infection.  Good range of motion of the toe.  Minimal tenderness but appears to be more surgical than anything else.  Radiographs taken today demonstrate complete removal of the internal fixation.  Assessment: Well-healing surgical foot.  Plan: Follow-up with her in 1 week for suture removal I will allow her to get back to work next week provided she can keep the pressure off of that surgical site.

## 2021-01-01 ENCOUNTER — Ambulatory Visit (INDEPENDENT_AMBULATORY_CARE_PROVIDER_SITE_OTHER): Payer: Federal, State, Local not specified - PPO | Admitting: Podiatry

## 2021-01-01 ENCOUNTER — Other Ambulatory Visit: Payer: Self-pay

## 2021-01-01 DIAGNOSIS — Z9889 Other specified postprocedural states: Secondary | ICD-10-CM

## 2021-01-01 NOTE — Progress Notes (Signed)
She presents today for postop visit #2 removal internal fixation first metatarsal of the right foot.  States that it is doing so much better she is very happy with the outcome thus far.  Objective: Sutures are intact margins well coapted she has been working already she says.  No signs of infection.  When I had removed all of the sutures today placed Steri-Strips allow her to start getting this wet we will follow-up with her in 2 weeks with questions or concerns.  Assessment: Well-healing surgical foot.  Plan: Removal in all internal fixation with surgery and removal of sutures today.  She is doing much better.  Follow-up with her 2 weeks

## 2021-01-02 DIAGNOSIS — M79676 Pain in unspecified toe(s): Secondary | ICD-10-CM

## 2021-01-15 ENCOUNTER — Encounter: Payer: Federal, State, Local not specified - PPO | Admitting: Podiatry

## 2021-01-29 ENCOUNTER — Encounter: Payer: Federal, State, Local not specified - PPO | Admitting: Podiatry

## 2021-04-26 ENCOUNTER — Other Ambulatory Visit: Payer: Self-pay | Admitting: Family Medicine

## 2021-04-26 DIAGNOSIS — F419 Anxiety disorder, unspecified: Secondary | ICD-10-CM

## 2021-04-26 NOTE — Telephone Encounter (Signed)
Requested medication (s) are due for refill today: yes  Requested medication (s) are on the active medication list: yes  Last refill:  11/27/20 #90 4 Rf  Future visit scheduled: no  Notes to clinic:  med not delegated to NT to RF   Requested Prescriptions  Pending Prescriptions Disp Refills   ALPRAZolam (XANAX) 0.5 MG tablet [Pharmacy Med Name: ALPRAZOLAM 0.5MG  TABLETS] 90 tablet     Sig: TAKE 1 TABLET(0.5 MG) BY MOUTH EVERY 6 HOURS AS NEEDED FOR ANXIETY      Not Delegated - Psychiatry:  Anxiolytics/Hypnotics Failed - 04/26/2021 10:41 AM      Failed - This refill cannot be delegated      Failed - Urine Drug Screen completed in last 360 days      Passed - Valid encounter within last 6 months    Recent Outpatient Visits           5 months ago Anxiety   [facility] Malva Limes, MD   1 year ago Gastroenteritis   [facility] [place], [name], [place]   2 years ago Callus of foot   [facility] Malva Limes, [place]   2 years ago Strain of lumbar region, initial encounter   [facility] [place], Lavella Hamm[place], [place]   2 years ago Dysuria   [facility] Malva Limes, MD

## 2021-05-23 ENCOUNTER — Other Ambulatory Visit: Payer: Self-pay | Admitting: Family Medicine

## 2021-05-23 DIAGNOSIS — F419 Anxiety disorder, unspecified: Secondary | ICD-10-CM

## 2021-07-30 ENCOUNTER — Other Ambulatory Visit: Payer: Self-pay | Admitting: Family Medicine

## 2021-07-30 DIAGNOSIS — F419 Anxiety disorder, unspecified: Secondary | ICD-10-CM

## 2021-07-30 NOTE — Telephone Encounter (Signed)
Requested medications are due for refill today.  yes  Requested medications are on the active medications list.  yes  Last refill. 04/29/2021  Future visit scheduled.   no  Notes to clinic.  Medication not delegated.

## 2021-07-31 ENCOUNTER — Other Ambulatory Visit: Payer: Self-pay | Admitting: Family Medicine

## 2021-08-18 ENCOUNTER — Ambulatory Visit: Payer: Self-pay

## 2021-08-18 NOTE — Telephone Encounter (Signed)
Patient called stating she has a cough, headache and possibly a fever. She is unable to get out of bed. She wanted to do a virtual visit but there are no appointments. Would like a call back please    Left message to call back.

## 2021-08-18 NOTE — Telephone Encounter (Signed)
FYI

## 2021-08-18 NOTE — Telephone Encounter (Signed)
Pt. Started having symptoms 3 days ago - fever, cough,fatigue. No fever today. Has not done a COVID test. No availability in the practice. Pt. Will do [email or web address] virtual visit.     Answer Assessment - Initial Assessment Questions 1. ONSET: "When did the cough begin?"      3 days ago  2. SEVERITY: "How bad is the cough today?"      Severe 3. SPUTUM: "Describe the color of your sputum" (none, dry cough; clear, white, yellow, green)     N/a 4. HEMOPTYSIS: "Are you coughing up any blood?" If so ask: "How much?" (flecks, streaks, tablespoons, etc.)     No 5. DIFFICULTY BREATHING: "Are you having difficulty breathing?" If Yes, ask: "How bad is it?" (e.g., mild, moderate, severe)    - MILD: No SOB at rest, mild SOB with walking, speaks normally in sentences, can lie down, no retractions, pulse < 100.    - MODERATE: SOB at rest, SOB with minimal exertion and prefers to sit, cannot lie down flat, speaks in phrases, mild retractions, audible wheezing, pulse 100-120.    - SEVERE: Very SOB at rest, speaks in single words, struggling to breathe, sitting hunched forward, retractions, pulse > 120      No 6. FEVER: "Do you have a fever?" If Yes, ask: "What is your temperature, how was it measured, and when did it start?"     No fever today 7. CARDIAC HISTORY: "Do you have any history of heart disease?" (e.g., heart attack, congestive heart failure)      No 8. LUNG HISTORY: "Do you have any history of lung disease?"  (e.g., pulmonary embolus, asthma, emphysema)     COPD 9. PE RISK FACTORS: "Do you have a history of blood clots?" (or: recent major surgery, recent prolonged travel, bedridden)     No 10. OTHER SYMPTOMS: "Do you have any other symptoms?" (e.g., runny nose, wheezing, chest pain)       Wheezing, runny nose 11. PREGNANCY: "Is there any chance you are pregnant?" "When was your last menstrual period?"       No 12. TRAVEL: "Have you traveled out of the country in the last month?" (e.g.,  travel history, exposures)       No  Protocols used: Cough - Acute Non-Productive-A-AH

## 2021-08-19 ENCOUNTER — Telehealth: Payer: Self-pay

## 2021-08-19 NOTE — Telephone Encounter (Signed)
Copied from CRM [id number]. Topic: Appointment Scheduling - Scheduling Inquiry for Clinic >> Aug 19, 2021 12:25 PM Daphine Deutscher D wrote: Reason for CRM: Pt would like to schedule an appt with Lillia Abed tomorrow.    CB#  [phone] >> Aug 19, 2021 12:27 PM Daphine Deutscher D wrote: T is having flu like symptoms and can not do a virtual

## 2021-08-20 NOTE — Telephone Encounter (Signed)
Spoke with patient. She said she was feeling better and the "worse was over".   She said if she needed an appt, she'd call back.

## 2021-10-28 ENCOUNTER — Other Ambulatory Visit: Payer: Self-pay | Admitting: Family Medicine

## 2021-10-28 DIAGNOSIS — F419 Anxiety disorder, unspecified: Secondary | ICD-10-CM

## 2021-10-29 NOTE — Telephone Encounter (Signed)
Requested medication (s) are due for refill today: yes  Requested medication (s) are on the active medication list: yes  Last refill:  Klonopin 05/23/21 #30 with 4 RF, Xanax 07/31/21 #90 with 2 RF  Future visit scheduled: no  Notes to clinic:  This medication can not be delegated, please assess.    Requested Prescriptions  Pending Prescriptions Disp Refills   clonazePAM (KLONOPIN) 0.5 MG tablet [Pharmacy Med Name: CLONAZEPAM 0.5MG  TABLETS] 30 tablet     Sig: TAKE 1 TABLET(0.5 MG) BY MOUTH AT BEDTIME     Not Delegated - Psychiatry: Anxiolytics/Hypnotics 2 Failed - 10/28/2021  7:12 PM      Failed - This refill cannot be delegated      Failed - Urine Drug Screen completed in last 360 days      Failed - Valid encounter within last 6 months    Recent Outpatient Visits           11 months ago Anxiety   [facility] Malva Limes, MD   2 years ago Gastroenteritis   [facility] [place], [place], [place]   3 years ago Callus of foot   [facility] Malva Limes, MD   3 years ago Strain of lumbar region, initial encounter   [facility] [place], [place]   3 years ago Dysuria   [facility] Malva Limes, MD              Passed - Patient is not pregnant       ALPRAZolam Prudy Feeler) 0.5 MG tablet [Pharmacy Med Name: ALPRAZOLAM 0.5MG  TABLETS] 90 tablet     Sig: TAKE 1 TABLET(0.5 MG) BY MOUTH EVERY 6 HOURS AS NEEDED FOR ANXIETY     Not Delegated - Psychiatry: Anxiolytics/Hypnotics 2 Failed - 10/28/2021  7:12 PM      Failed - This refill cannot be delegated      Failed - Urine Drug Screen completed in last 360 days      Failed - Valid encounter within last 6 months    Recent Outpatient Visits           11 months ago Anxiety   [facility] Malva Limes, MD   2 years ago Gastroenteritis   [facility] [place], [place], [place]   3 years ago Callus of foot    [facility] Malva Limes, MD   3 years ago Strain of lumbar region, initial encounter   [facility] [place], [place]   3 years ago Dysuria   [facility] Malva Limes, MD              Passed - Patient is not pregnant

## 2021-10-29 NOTE — Telephone Encounter (Signed)
Appointment has been moved to 11/04/2021 at 9:40am. Patient says she will run out of medication in 3 days. She is requesting a refill to get by until her appointment. Please advise.

## 2021-10-29 NOTE — Telephone Encounter (Addendum)
Patient was advised an appointment is needed for further refills. PCP next available appointment is not until May 5th, patient is scheduled. Patient would like PCP to refill her medications until appointment or work her in sooner. Patient requesting a call back from the nurse  [id number] [place]ree [place],  - [address] AT [facility] [address] [facility] Phone:  [phone]  Fax:  [phone]

## 2021-10-29 NOTE — Addendum Note (Signed)
Addended by: Benjiman Core on: 10/29/2021 03:07 PM   Modules accepted: Orders

## 2021-11-01 MED ORDER — CLONAZEPAM 0.5 MG PO TABS: ORAL_TABLET | ORAL | 4 refills | Status: DC

## 2021-11-01 MED ORDER — ALPRAZOLAM 0.5 MG PO TABS: ORAL_TABLET | ORAL | 2 refills | Status: DC

## 2021-11-01 NOTE — Addendum Note (Signed)
Addended by: Malva Limes on: 11/01/2021 08:46 AM   Modules accepted: Orders

## 2021-11-04 ENCOUNTER — Encounter: Payer: Self-pay | Admitting: Family Medicine

## 2021-11-04 ENCOUNTER — Ambulatory Visit: Payer: Federal, State, Local not specified - PPO | Admitting: Family Medicine

## 2021-11-04 ENCOUNTER — Other Ambulatory Visit: Payer: Self-pay

## 2021-11-04 VITALS — BP 140/90 | HR 72 | Temp 98.7°F | Resp 16 | Wt 115.0 lb

## 2021-11-04 DIAGNOSIS — I700 Atherosclerosis of aorta: Secondary | ICD-10-CM

## 2021-11-04 DIAGNOSIS — Z720 Tobacco use: Secondary | ICD-10-CM

## 2021-11-04 DIAGNOSIS — G2581 Restless legs syndrome: Secondary | ICD-10-CM | POA: Diagnosis not present

## 2021-11-04 DIAGNOSIS — E7800 Pure hypercholesterolemia, unspecified: Secondary | ICD-10-CM | POA: Diagnosis not present

## 2021-11-04 DIAGNOSIS — F419 Anxiety disorder, unspecified: Secondary | ICD-10-CM | POA: Diagnosis not present

## 2021-11-04 DIAGNOSIS — F5101 Primary insomnia: Secondary | ICD-10-CM

## 2021-11-04 DIAGNOSIS — Z23 Encounter for immunization: Secondary | ICD-10-CM | POA: Diagnosis not present

## 2021-11-04 MED ORDER — ALPRAZOLAM 0.5 MG PO TABS: ORAL_TABLET | ORAL | 2 refills | Status: DC

## 2021-11-04 MED ORDER — CLONAZEPAM 0.5 MG PO TABS: ORAL_TABLET | ORAL | 4 refills | Status: DC

## 2021-11-04 MED ORDER — ZOLPIDEM TARTRATE 5 MG PO TABS: 5.0000 mg | ORAL_TABLET | Freq: Every evening | ORAL | 1 refills | Status: DC | PRN

## 2021-11-04 NOTE — Patient Instructions (Addendum)
Please review the attached list of medications and notify my office if there are any errors.   You will be due for your yearly lung CT scan in mid March. You should get a call around that time from the Pulmonary clinic to have it scheduled.   You can take either alprazolam or Ambien (zolpidem) at bedtime, but NOT both

## 2021-11-04 NOTE — Progress Notes (Signed)
I,Roshena L Chambers,acting as a scribe for Mila Merry, MD.,have documented all relevant documentation on the behalf of Mila Merry, MD,as directed by  Mila Merry, MD while in the presence of Mila Merry, MD.   Established patient visit   Patient: Kristen Wright   DOB: 1961-08-21   61 y.o. Female  MRN: [id number] Visit Date: 11/04/2021  Today's healthcare provider: Mila Merry, MD   Chief Complaint  Patient presents with   Insomnia   Hyperlipidemia   Anxiety   Subjective    HPI  Anxiety, Follow-up  She was last seen for anxiety 1  year  ago. Changes made at last visit include none; continue same medication regiment.   She reports good compliance with treatment. She reports good tolerance of treatment. She is not having side effects.   She feels her anxiety is moderate and Unchanged since last visit.  Symptoms: No chest pain No difficulty concentrating  No dizziness No fatigue  No feelings of losing control Yes insomnia  No irritable No palpitations  No panic attacks No racing thoughts  No shortness of breath No sweating  No tremors/shakes    GAD-7 Results No flowsheet data found.  PHQ-9 Scores PHQ9 SCORE ONLY 11/04/2021 11/08/2020 01/06/2018  PHQ-9 Total Score 15 10 16     ---------------------------------------------------------------------------------------------------   Lipid/Cholesterol, Follow-up  Last lipid panel Other pertinent labs  Lab Results  Component Value Date   CHOL 178 11/08/2020   HDL 69 11/08/2020   LDLCALC 100 (H) 11/08/2020   TRIG 46 11/08/2020   CHOLHDL 2.6 11/08/2020   Lab Results  Component Value Date   ALT 11 11/08/2020   AST 14 11/08/2020   PLT 273 11/08/2020   TSH 0.780 11/08/2020     She was last seen for this 1  year  ago.  Management since that visit includes continue same medication.  She reports good compliance with treatment. She is not having side effects.   Symptoms: No chest pain No chest  pressure/discomfort  No dyspnea No lower extremity edema  No numbness or tingling of extremity No orthopnea  No palpitations No paroxysmal nocturnal dyspnea  No speech difficulty No syncope   Current diet: in general, an "unhealthy" diet Current exercise: walking  The 10-year ASCVD risk score (Arnett DK, et al., 2019) is: 5.4%  ---------------------------------------------------------------------------------------------------   Follow up for insomnia:  The patient was last seen for this 1  year  ago. Changes made at last visit include none; fairly well managed with hs clonazepam.  She states she had been taking 2 alprazolam and a clonazepam just to fall asleep, but she would still only sleep a few hours at night. She ran out of clonazepam a week or so ago and has been having more trouble with restless legs since then.   She reports good compliance with treatment. She feels that condition is Unchanged. She is not having side effects.   -----------------------------------------------------------------------------------------   Medications: Outpatient Medications Prior to Visit  Medication Sig   ALPRAZolam (XANAX) 0.5 MG tablet TAKE 1 TABLET(0.5 MG) BY MOUTH EVERY 6 HOURS AS NEEDED FOR ANXIETY   clonazePAM (KLONOPIN) 0.5 MG tablet TAKE 1 TABLET(0.5 MG) BY MOUTH AT BEDTIME   lovastatin (MEVACOR) 20 MG tablet TAKE 1 TABLET BY MOUTH DAILY   [DISCONTINUED] ondansetron (ZOFRAN) 4 MG tablet Take 1 tablet (4 mg total) by mouth every 8 (eight) hours as needed. (Patient not taking: Reported on 11/04/2021)   No facility-administered medications prior to visit.  Review of Systems  Constitutional:  Negative for appetite change, chills, fatigue and fever.  Respiratory:  Negative for chest tightness and shortness of breath.   Cardiovascular:  Negative for chest pain and palpitations.  Gastrointestinal:  Negative for abdominal pain, nausea and vomiting.  Neurological:  Negative for dizziness  and weakness.  Psychiatric/Behavioral:  Positive for sleep disturbance.       Objective    BP 140/90 (BP Location: Left Arm, Patient Position: Sitting, Cuff Size: Normal)    Pulse 72    Temp 98.7 F (37.1 C) (Oral)    Resp 16    Wt 115 lb (52.2 kg)    SpO2 99% Comment: room air   BMI 19.74 kg/m  {Show previous vital signs (optional):23777}  Physical Exam   General: Appearance:    Thin female in no acute distress  Eyes:    PERRL, conjunctiva/corneas clear, EOM's intact       Lungs:     Clear to auscultation bilaterally, respirations unlabored  Heart:    Normal heart rate. Normal rhythm. No murmurs, rubs, or gallops.    MS:   All extremities are intact.    Neurologic:   Awake, alert, oriented x 3. No apparent focal neurological defect.         Assessment & Plan     1. Anxiety Refill  ALPRAZolam (XANAX) 0.5 MG tablet; TAKE 1 TABLET(0.5 MG) BY MOUTH EVERY 6 HOURS AS NEEDED FOR ANXIETY  Dispense: 90 tablet; Refill: 2  2. Restless leg syndrome refill clonazePAM (KLONOPIN) 0.5 MG tablet; TAKE 1 TABLET(0.5 MG) BY MOUTH AT BEDTIME  Dispense: 30 tablet; Refill: 4 - Ferritin  3. Primary insomnia  Failed trazodone in the past. Instead of hs alprazolam will try zolpidem (AMBIEN) 5 MG tablet; Take 1 tablet (5 mg total) by mouth at bedtime as needed for sleep.  Dispense: 30 tablet; Refill: 1  4. Aortic atherosclerosis (HCC) Asymptomatic. Compliant with medication.  Continue aggressive risk factor modification.    5. Pure hypercholesterolemia She is tolerating lovastatin well with no adverse effects.   - CBC - Comprehensive metabolic panel - Lipid panel  6. Tobacco use Encouraged smoking cessation. Tried bupropion in the past which didn't work. She is not interested in trying a different medication for smoking cessation. Advised she will be due for LDCT in march.    7. Need for shingles vaccine  - Administer Zoster, Recombinant (Shingrix) Vaccine  8. Need for tetanus, diphtheria,  and acellular pertussis (Tdap) vaccine in patient of adolescent age or older  - Administer Tetanus-diphtheria-acellular pertussis (Tdap) vaccine   She declined flu vaccin     The entirety of the information documented in the History of Present Illness, Review of Systems and Physical Exam were personally obtained by me. Portions of this information were initially documented by the CMA and reviewed by me for thoroughness and accuracy.     Mila Merry, MD  [facility] [phone] (phone) [phone] (fax)  [facility]

## 2021-11-05 ENCOUNTER — Encounter: Payer: Self-pay | Admitting: Family Medicine

## 2021-11-05 LAB — LIPID PANEL
Chol/HDL Ratio: 2.7 ratio (ref 0.0–4.4)
Cholesterol, Total: 195 mg/dL (ref 100–199)
HDL: 71 mg/dL (ref >39)
LDL Chol Calc (NIH): 111 mg/dL — ABNORMAL HIGH (ref 0–99)
Triglycerides: 74 mg/dL (ref 0–149)
VLDL Cholesterol Cal: 13 mg/dL (ref 5–40)

## 2021-11-05 LAB — CBC
Hematocrit: 47.1 % — ABNORMAL HIGH (ref 34.0–46.6)
Hemoglobin: 15.7 g/dL (ref 11.1–15.9)
MCH: 30 pg (ref 26.6–33.0)
MCHC: 33.3 g/dL (ref 31.5–35.7)
MCV: 90 fL (ref 79–97)
Platelets: 260 10*3/uL (ref 150–450)
RBC: 5.24 x10E6/uL (ref 3.77–5.28)
RDW: 12.7 % (ref 11.7–15.4)
WBC: 6.4 10*3/uL (ref 3.4–10.8)

## 2021-11-05 LAB — COMPREHENSIVE METABOLIC PANEL
ALT: 9 IU/L (ref 0–32)
AST: 16 IU/L (ref 0–40)
Albumin/Globulin Ratio: 2.1 (ref 1.2–2.2)
Albumin: 5.1 g/dL — ABNORMAL HIGH (ref 3.8–4.9)
Alkaline Phosphatase: 80 IU/L (ref 44–121)
BUN/Creatinine Ratio: 15 (ref 9–23)
BUN: 14 mg/dL (ref 6–24)
Bilirubin Total: 0.3 mg/dL (ref 0.0–1.2)
CO2: 27 mmol/L (ref 20–29)
Calcium: 9.9 mg/dL (ref 8.7–10.2)
Chloride: 101 mmol/L (ref 96–106)
Creatinine, Ser: 0.96 mg/dL (ref 0.57–1.00)
Globulin, Total: 2.4 g/dL (ref 1.5–4.5)
Glucose: 97 mg/dL (ref 70–99)
Potassium: 5 mmol/L (ref 3.5–5.2)
Sodium: 140 mmol/L (ref 134–144)
Total Protein: 7.5 g/dL (ref 6.0–8.5)
eGFR: 69 mL/min/{1.73_m2} (ref >59)

## 2021-11-05 LAB — FERRITIN: Ferritin: 90 ng/mL (ref 15–150)

## 2021-12-01 ENCOUNTER — Other Ambulatory Visit: Payer: Self-pay | Admitting: Family Medicine

## 2021-12-01 DIAGNOSIS — E7800 Pure hypercholesterolemia, unspecified: Secondary | ICD-10-CM

## 2021-12-25 ENCOUNTER — Other Ambulatory Visit: Payer: Self-pay | Admitting: Family Medicine

## 2021-12-25 DIAGNOSIS — F17210 Nicotine dependence, cigarettes, uncomplicated: Secondary | ICD-10-CM

## 2022-01-01 ENCOUNTER — Other Ambulatory Visit: Payer: Self-pay | Admitting: Family Medicine

## 2022-01-01 DIAGNOSIS — F5101 Primary insomnia: Secondary | ICD-10-CM

## 2022-01-01 NOTE — Telephone Encounter (Signed)
Requested medication (s) are due for refill today: yes ? ?Requested medication (s) are on the active medication list: yes ? ?Last refill:  12/01/21 ? ?Future visit scheduled:yes ? ?Notes to clinic:  Unable to refill per protocol, cannot delegate. ? ? ? ?  ?Requested Prescriptions  ?Pending Prescriptions Disp Refills  ? zolpidem (AMBIEN) 5 MG tablet [Pharmacy Med Name: ZOLPIDEM 5MG  TABLETS] 30 tablet   ?  Sig: TAKE 1 TABLET(5 MG) BY MOUTH AT BEDTIME AS NEEDED FOR SLEEP  ?  ? Not Delegated - Psychiatry:  Anxiolytics/Hypnotics Failed - 01/01/2022  3:26 PM  ?  ?  Failed - This refill cannot be delegated  ?  ?  Failed - Urine Drug Screen completed in last 360 days  ?  ?  Passed - Valid encounter within last 6 months  ?  Recent Outpatient Visits   ? ?      ? 1 month ago Anxiety  ? [facility] Caryn Section, Kirstie Peri, MD  ? 1 year ago Anxiety  ? [facility] Caryn Section, Kirstie Peri, MD  ? 2 years ago Gastroenteritis  ? [place], [place]  ? 3 years ago Callus of foot  ? [facility] Caryn Section, Kirstie Peri, MD  ? 3 years ago Strain of lumbar region, initial encounter  ? [facility] [place], [place], [place]  ? ?  ?  ? ?  ?  ?  ? ? ?

## 2022-01-12 ENCOUNTER — Telehealth: Payer: Self-pay | Admitting: *Deleted

## 2022-01-12 ENCOUNTER — Other Ambulatory Visit: Payer: Self-pay | Admitting: *Deleted

## 2022-01-12 DIAGNOSIS — Z122 Encounter for screening for malignant neoplasm of respiratory organs: Secondary | ICD-10-CM

## 2022-01-12 DIAGNOSIS — F17210 Nicotine dependence, cigarettes, uncomplicated: Secondary | ICD-10-CM

## 2022-01-12 DIAGNOSIS — Z87891 Personal history of nicotine dependence: Secondary | ICD-10-CM

## 2022-01-12 NOTE — Telephone Encounter (Signed)
LMTC to schedule Yearly Lung CA CT Scan. 

## 2022-01-20 ENCOUNTER — Encounter: Payer: Self-pay | Admitting: Family Medicine

## 2022-01-20 ENCOUNTER — Ambulatory Visit (INDEPENDENT_AMBULATORY_CARE_PROVIDER_SITE_OTHER): Payer: Federal, State, Local not specified - PPO | Admitting: Family Medicine

## 2022-01-20 VITALS — BP 132/90 | HR 70 | Temp 97.7°F | Resp 16 | Wt 115.0 lb

## 2022-01-20 DIAGNOSIS — Z23 Encounter for immunization: Secondary | ICD-10-CM | POA: Diagnosis not present

## 2022-01-20 NOTE — Progress Notes (Signed)
Immunization administration only. No E&M service today.  

## 2022-01-23 ENCOUNTER — Ambulatory Visit: Payer: Federal, State, Local not specified - PPO | Admitting: Family Medicine

## 2022-01-27 ENCOUNTER — Ambulatory Visit
Admission: RE | Admit: 2022-01-27 | Discharge: 2022-01-27 | Disposition: A | Discharge status: Home / Self Care | Payer: Federal, State, Local not specified - PPO | Source: Ambulatory Visit | Attending: Acute Care | Admitting: Acute Care

## 2022-01-27 ENCOUNTER — Other Ambulatory Visit: Payer: Self-pay

## 2022-01-27 DIAGNOSIS — Z87891 Personal history of nicotine dependence: Secondary | ICD-10-CM | POA: Insufficient documentation

## 2022-01-27 DIAGNOSIS — F17210 Nicotine dependence, cigarettes, uncomplicated: Secondary | ICD-10-CM | POA: Diagnosis not present

## 2022-01-27 DIAGNOSIS — Z122 Encounter for screening for malignant neoplasm of respiratory organs: Secondary | ICD-10-CM | POA: Diagnosis not present

## 2022-01-28 ENCOUNTER — Encounter: Payer: Self-pay | Admitting: Family Medicine

## 2022-01-28 DIAGNOSIS — J439 Emphysema, unspecified: Secondary | ICD-10-CM | POA: Insufficient documentation

## 2022-01-29 ENCOUNTER — Other Ambulatory Visit: Payer: Self-pay | Admitting: Acute Care

## 2022-01-29 ENCOUNTER — Other Ambulatory Visit: Payer: Self-pay | Admitting: Family Medicine

## 2022-01-29 DIAGNOSIS — F17210 Nicotine dependence, cigarettes, uncomplicated: Secondary | ICD-10-CM

## 2022-01-29 DIAGNOSIS — Z87891 Personal history of nicotine dependence: Secondary | ICD-10-CM

## 2022-01-29 DIAGNOSIS — F419 Anxiety disorder, unspecified: Secondary | ICD-10-CM

## 2022-01-29 DIAGNOSIS — Z122 Encounter for screening for malignant neoplasm of respiratory organs: Secondary | ICD-10-CM

## 2022-01-30 ENCOUNTER — Ambulatory Visit: Payer: Self-pay | Admitting: *Deleted

## 2022-01-30 NOTE — Telephone Encounter (Signed)
?  Chief Complaint: Needing refill authorization for Xanax 0.5 mg per her pharmacist. ?Symptoms: N/A ?Frequency: N/A ?Pertinent Negatives: Patient denies N/A ?Disposition: [] ED /[] Urgent Care (no appt availability in office) / [] Appointment(In office/virtual)/ []  [place] [facility]/ [] Home Care/ [] Refused Recommended Disposition /[]  Mobile Bus/ [x]  Follow-up with PCP ?Additional Notes: Message sent to Dr. for the Xanax refill authorization.  ?

## 2022-01-30 NOTE — Telephone Encounter (Signed)
I returned pt's call.   She has concerns relataed to the Alprazolam (Xanax) 0.5 mg tablet quantity. ? ? ?Reason for Disposition ? Caller requesting a CONTROLLED substance prescription refill (e.g., narcotics, ADHD medicines) ?   Refill authorization for Xanax 0.5 mg tablets ? ?Answer Assessment - Initial Assessment Questions ?1. NAME of MEDICATION: "What medicine are you calling about?" ?    Xanax 0.5 mg ?2. QUESTION: "What is your question?" (e.g., double dose of medicine, side effect) ?    It was denied.   Pharmacist said there are no more refills and it needs the doctor's authorization. ?3. PRESCRIBING HCP: "Who prescribed it?" Reason: if prescribed by specialist, call should be referred to that group. ?    Dr. Sherrie Mustache ?4. SYMPTOMS: "Do you have any symptoms?" ?    N/A ?5. SEVERITY: If symptoms are present, ask "Are they mild, moderate or severe?" ?    N/A ?6. PREGNANCY:  "Is there any chance that you are pregnant?" "When was your last menstrual period?" ?    N/A ? ?Protocols used: Medication Question Call-A-AH, Medication Refill and Renewal Call-A-AH ? ?

## 2022-05-13 ENCOUNTER — Ambulatory Visit: Payer: Self-pay | Admitting: *Deleted

## 2022-05-13 NOTE — Telephone Encounter (Signed)
  Chief Complaint: sore throat, throat swelling , earache Symptoms:  headache , dry cough, sore throat , feels like "throat swolllen" right earache with swelling under ear. Sweating when woke up this am . Unknown temp. Lightheaded. Frequency: yesterday  Pertinent Negatives: Patient denies chest pain no difficulty breathing, no sob. No fever reported.  Disposition: [] ED /[] Urgent Care (no appt availability in office) / [x] Appointment(In office/virtual)/ []  [place] [facility]/ [] Home Care/ [] Refused Recommended Disposition /[]  Mobile Bus/ []  Follow-up with PCP Additional Notes: patient has not tested for covid, advised if sx worsen go to ED.   Reason for Disposition  [1] COVID-19 infection suspected by caller or triager AND [2] mild symptoms (cough, fever, or others) AND [3] negative COVID-19 rapid test    Has not tested for covid  Answer Assessment - Initial Assessment Questions 1. COVID-19 DIAGNOSIS: "How do you know that you have COVID?" (e.g., positive lab test or self-test, diagnosed by doctor or NP/PA, symptoms after exposure).     Has not tested for covid  2. COVID-19 EXPOSURE: "Was there any known exposure to COVID before the symptoms began?" CDC Definition of close contact: within 6 feet (2 meters) for a total of 15 minutes or more over a 24-hour period.      na 3. ONSET: "When did the COVID-19 symptoms start?"      Yesterday  4. WORST SYMPTOM: "What is your worst symptom?" (e.g., cough, fever, shortness of breath, muscle aches)     Sore throat and feels like throat swelling. Earache,  5. COUGH: "Do you have a cough?" If Yes, ask: "How bad is the cough?"       Dry cough  6. FEVER: "Do you have a fever?" If Yes, ask: "What is your temperature, how was it measured, and when did it start?"     unknown 7. RESPIRATORY STATUS: "Describe your breathing?" (e.g., normal; shortness of breath, wheezing, unable to speak)      Normal  8. BETTER-SAME-WORSE: "Are you getting  better, staying the same or getting worse compared to yesterday?"  If getting worse, ask, "In what way?"     Getting worse 9. OTHER SYMPTOMS: "Do you have any other symptoms?"  (e.g., chills, fatigue, headache, loss of smell or taste, muscle pain, sore throat)     Headache, sore throat, earache, dry cough, right side under ear swelling, sweating when woke up this am  10. HIGH RISK DISEASE: "Do you have any chronic medical problems?" (e.g., asthma, heart or lung disease, weak immune system, obesity, etc.)       Denies  11. VACCINE: "Have you had the COVID-19 vaccine?" If Yes, ask: "Which one, how many shots, when did you get it?"       no 12. PREGNANCY: "Is there any chance you are pregnant?" "When was your last menstrual period?"       na 13. O2 SATURATION MONITOR:  "Do you use an oxygen saturation monitor (pulse oximeter) at home?" If Yes, ask "What is your reading (oxygen level) today?" "What is your usual oxygen saturation reading?" (e.g., 95%)       na  Protocols used: Coronavirus (COVID-19) Diagnosed or Suspected-A-AH

## 2022-05-14 ENCOUNTER — Ambulatory Visit: Payer: Federal, State, Local not specified - PPO | Admitting: Physician Assistant

## 2022-05-14 ENCOUNTER — Telehealth: Payer: Self-pay

## 2022-05-14 ENCOUNTER — Encounter: Payer: Self-pay | Admitting: Physician Assistant

## 2022-05-14 VITALS — BP 116/92 | HR 102 | Temp 97.6°F | Resp 16 | Wt 114.0 lb

## 2022-05-14 DIAGNOSIS — H9201 Otalgia, right ear: Secondary | ICD-10-CM

## 2022-05-14 DIAGNOSIS — J029 Acute pharyngitis, unspecified: Secondary | ICD-10-CM | POA: Diagnosis not present

## 2022-05-14 DIAGNOSIS — R519 Headache, unspecified: Secondary | ICD-10-CM | POA: Diagnosis not present

## 2022-05-14 LAB — POCT INFLUENZA A/B
Influenza A, POC: NEGATIVE
Influenza B, POC: NEGATIVE

## 2022-05-14 LAB — POCT RAPID STREP A (OFFICE): Rapid Strep A Screen: NEGATIVE

## 2022-05-14 NOTE — Telephone Encounter (Signed)
Pt informed and agreeable to all recommendations.

## 2022-05-14 NOTE — Telephone Encounter (Signed)
Copied from CRM [id number]. Topic: General - Call Back - No Documentation >> May 14, 2022 10:36 AM Franchot Heidelberg wrote: Reason for CRM: Pt saw Debera Lat and was told to call back with Covid results, she tested negative.   Best contact: [phone]

## 2022-05-14 NOTE — Progress Notes (Signed)
I,Bowdy Bair Robinson,acting as a Neurosurgeon for [employer], PA-C.,have documented all relevant documentation on the behalf of Debera Lat, PA-C,as directed by  [employer], PA-C while in the presence of [employer], PA-C.   Established patient visit   Patient: Kristen Wright   DOB: 01-03-1961   61 y.o. Female  MRN: [id number] Visit Date: 05/14/2022  Today's healthcare provider: Debera Lat, PA-C   Chief Complaint  Patient presents with   Sore Throat   Subjective    Patient presents today for headache/frontal and throbbing, sore throat, dry cough, throat swelling, earache/right with swelling under ear. Sweating with waking yesterday and lightheadedness.  Denies chest pain, no difficulty breathing, no SOB or fever. No known exposure. Taking Tylenol for headache with some relief.  Reports drinking water, but difficult with sore throat. Trying to stay hydrated.   Patient has not tested for Covid.   Medications: Outpatient Medications Prior to Visit  Medication Sig   ALPRAZolam (XANAX) 0.5 MG tablet TAKE 1 TABLET(0.5 MG) BY MOUTH EVERY 6 HOURS AS NEEDED FOR ANXIETY   clonazePAM (KLONOPIN) 0.5 MG tablet TAKE 1 TABLET(0.5 MG) BY MOUTH AT BEDTIME   lovastatin (MEVACOR) 20 MG tablet TAKE 1 TABLET BY MOUTH DAILY   zolpidem (AMBIEN) 5 MG tablet TAKE 1 TABLET(5 MG) BY MOUTH AT BEDTIME AS NEEDED FOR SLEEP   No facility-administered medications prior to visit.    Review of Systems  All other systems reviewed and are negative. Except see HPI     Objective    BP (!) 120/90 (BP Location: Right Arm, Patient Position: Sitting, Cuff Size: Normal)   Pulse (!) 102   Temp 97.6 F (36.4 C) (Oral)   Resp 16   Wt 114 lb (51.7 kg)   SpO2 97%   BMI 19.57 kg/m   Vitals:   05/14/22 0911  BP: (!) 120/90  Pulse: (!) 102  Resp: 16  Temp: 97.6 F (36.4 C)  SpO2: 97%    Physical Exam Vitals reviewed.  Constitutional:      General: She is not in acute distress.    Appearance:  Normal appearance. She is well-developed and normal weight. She is not diaphoretic.  HENT:     Head: Normocephalic and atraumatic.     Nose: Congestion and rhinorrhea present.     Mouth/Throat:     Mouth: No oral lesions.     Pharynx: Pharyngeal swelling and posterior oropharyngeal erythema present. No oropharyngeal exudate.     Tonsils: No tonsillar exudate. 2+ on the right. 2+ on the left.  Eyes:     General: No scleral icterus.    Conjunctiva/sclera: Conjunctivae normal.  Neck:     Thyroid: No thyromegaly.  Cardiovascular:     Rate and Rhythm: Normal rate and regular rhythm.     Pulses: Normal pulses.     Heart sounds: Normal heart sounds. No murmur heard. Pulmonary:     Effort: Pulmonary effort is normal. No respiratory distress.     Breath sounds: Normal breath sounds. No wheezing, rhonchi or rales.  Abdominal:     General: Bowel sounds are normal.     Palpations: Abdomen is soft.  Musculoskeletal:     Cervical back: Normal range of motion and neck supple.     Right lower leg: No edema.     Left lower leg: No edema.  Lymphadenopathy:     Cervical: Cervical adenopathy present.  Skin:    General: Skin is warm and dry.  Findings: No rash.  Neurological:     Mental Status: She is alert and oriented to person, place, and time. Mental status is at baseline.  Psychiatric:        Mood and Affect: Mood normal.        Behavior: Behavior normal.      No results found for any visits on 05/14/22.  Assessment & Plan     Sore throat Could be due to URI Pt advised to have a rapid covid test at home - POCT rapid strep A - POCT Influenza A/B Symptomatic treatment Will benefit from antibiotic? Symptomatic treatment with OTC pain medications, every 4-6 hours, warm salt gargle and hot tea with honey Patient was instructed not to fill it unless their symptoms worsen   Rx for azithromycin was sent  Discussed the potential negative side effects of antibiotics and that they can  lead to resistance if used unnecessarily Discussed expectations for duration of symptoms. Discussed that they may feel bad for up to a week, but sometimes symptoms last longer (especially if you smoke)  Explained that you can get multiple viruses in a row and recommend prevention strategies such as hand washing    Frontal headache Could be due to acute rhinosinusitis, URI Saline nasal rinse, flonase, adequate hydration recommended   FU PRN    The patient was advised to call back or seek an in-person evaluation if the symptoms worsen or if the condition fails to improve as anticipated.  I discussed the assessment and treatment plan with the patient. The patient was provided an opportunity to ask questions and all were answered. The patient agreed with the plan and demonstrated an understanding of the instructions.  The entirety of the information documented in the History of Present Illness, Review of Systems and Physical Exam were personally obtained by me. Portions of this information were initially documented by the CMA and reviewed by me for thoroughness and accuracy.  Portions of this note were created using dictation software and may contain typographical errors.    Debera Lat, PA-C  [facility] [phone] (phone) [phone] (fax)  [facility]

## 2022-05-15 ENCOUNTER — Telehealth: Payer: Self-pay | Admitting: Physician Assistant

## 2022-05-15 MED ORDER — AZITHROMYCIN 250 MG PO TABS: ORAL_TABLET | ORAL | 0 refills | Status: AC

## 2022-05-15 NOTE — Telephone Encounter (Signed)
Patient advised.

## 2022-05-15 NOTE — Telephone Encounter (Signed)
Please, let pt know that I sent an abx for her to use in case, supportive treatment will not be successful.

## 2022-05-30 ENCOUNTER — Other Ambulatory Visit: Payer: Self-pay | Admitting: Family Medicine

## 2022-05-30 DIAGNOSIS — G2581 Restless legs syndrome: Secondary | ICD-10-CM

## 2022-05-30 DIAGNOSIS — F419 Anxiety disorder, unspecified: Secondary | ICD-10-CM

## 2022-08-03 ENCOUNTER — Other Ambulatory Visit: Payer: Self-pay | Admitting: Family Medicine

## 2022-08-03 DIAGNOSIS — G2581 Restless legs syndrome: Secondary | ICD-10-CM

## 2022-08-26 ENCOUNTER — Other Ambulatory Visit: Payer: Self-pay | Admitting: Family Medicine

## 2022-08-26 DIAGNOSIS — G2581 Restless legs syndrome: Secondary | ICD-10-CM

## 2022-08-26 NOTE — Telephone Encounter (Signed)
Requested medication (s) are due for refill today: sending to different pharm.   Requested medication (s) are on the active medication list: yes  Last refill:  08/04/22 #30/4  Future visit scheduled: no  Notes to clinic:  Unable to refill per protocol, cannot delegate. Sending to different pharmacy      Requested Prescriptions  Pending Prescriptions Disp Refills   clonazePAM (KLONOPIN) 0.5 MG tablet 30 tablet 4    Sig: TAKE 1 TABLET BY MOUTH AT BEDTIME     Not Delegated - Psychiatry: Anxiolytics/Hypnotics 2 Failed - 08/26/2022  5:15 PM      Failed - This refill cannot be delegated      Failed - Urine Drug Screen completed in last 360 days      Passed - Patient is not pregnant      Passed - Valid encounter within last 6 months    Recent Outpatient Visits           3 months ago Sore throat   [facility] [place], [place], PA-C   7 months ago Need for shingles vaccine   [facility] Malva Limes, MD   9 months ago Anxiety   [facility] Malva Limes, MD   1 year ago Anxiety   [facility] Malva Limes, MD   3 years ago Gastroenteritis   [facility] [place], [place], [place]

## 2022-08-26 NOTE — Telephone Encounter (Signed)
Medication Refill - Medication: clonazePAM (KLONOPIN) 0.5 MG tablet   Has the patient contacted their pharmacy? No   Preferred Pharmacy (with phone number or street name):  [id number] - [place], [place] - [address] AT [facility] [address] [facility] Phone: [phone]  Fax: [phone]     Has the patient been seen for an appointment in the last year OR does the patient have an upcoming appointment? Yes.    Please assist patient further

## 2022-08-27 ENCOUNTER — Other Ambulatory Visit: Payer: Self-pay | Admitting: Family Medicine

## 2022-08-27 DIAGNOSIS — F419 Anxiety disorder, unspecified: Secondary | ICD-10-CM

## 2022-08-27 MED ORDER — CLONAZEPAM 0.5 MG PO TABS: ORAL_TABLET | ORAL | 2 refills | Status: DC

## 2022-08-27 NOTE — Telephone Encounter (Signed)
Requested medication (s) are due for refill today: yes  Requested medication (s) are on the active medication list: yes  Last refill:  05/31/22 #90 2 RF  Future visit scheduled: no  Notes to clinic:  med not delegated to NT to RF   Requested Prescriptions  Pending Prescriptions Disp Refills   ALPRAZolam (XANAX) 0.5 MG tablet [Pharmacy Med Name: ALPRAZOLAM 0.5MG  TABLETS] 90 tablet     Sig: TAKE 1 TABLET(0.5 MG) BY MOUTH EVERY 6 HOURS AS NEEDED FOR ANXIETY     Not Delegated - Psychiatry: Anxiolytics/Hypnotics 2 Failed - 08/27/2022  3:58 PM      Failed - This refill cannot be delegated      Failed - Urine Drug Screen completed in last 360 days      Passed - Patient is not pregnant      Passed - Valid encounter within last 6 months    Recent Outpatient Visits           3 months ago Sore throat   [facility] [place], [place], PA-C   7 months ago Need for shingles vaccine   [facility] Malva Limes, MD   9 months ago Anxiety   [facility] Malva Limes, MD   1 year ago Anxiety   [facility] Malva Limes, MD   3 years ago Gastroenteritis   [facility] [place], [place], [place]

## 2022-11-27 ENCOUNTER — Other Ambulatory Visit: Payer: Self-pay | Admitting: Family Medicine

## 2022-11-27 DIAGNOSIS — G2581 Restless legs syndrome: Secondary | ICD-10-CM

## 2022-11-27 NOTE — Telephone Encounter (Signed)
Requested medications are due for refill today.  Provider to determine.  Requested medications are on the active medications list.  yes  Last refill. 08/27/2022 #30 2 rf  Future visit scheduled.   no  Notes to clinic.  Refill not delegated.    Requested Prescriptions  Pending Prescriptions Disp Refills   clonazePAM (KLONOPIN) 0.5 MG tablet [Pharmacy Med Name: CLONAZEPAM 0.'5MG'$  TABLETS] 30 tablet     Sig: TAKE 1 TABLET BY MOUTH AT BEDTIME     Not Delegated - Psychiatry: Anxiolytics/Hypnotics 2 Failed - 11/27/2022  4:18 PM      Failed - This refill cannot be delegated      Failed - Urine Drug Screen completed in last 360 days      Failed - Valid encounter within last 6 months    Recent Outpatient Visits           6 months ago Sore throat   [place], [place], PA-C   10 months ago Need for shingles vaccine   [facility] Birdie Sons, MD   1 year ago [place], Donald E, MD   2 years ago [place], Donald E, MD   3 years ago [place], Jennifer [place], [place] - Patient is not pregnant

## 2022-12-30 ENCOUNTER — Other Ambulatory Visit: Payer: Self-pay | Admitting: Family Medicine

## 2022-12-30 DIAGNOSIS — F419 Anxiety disorder, unspecified: Secondary | ICD-10-CM

## 2022-12-31 ENCOUNTER — Telehealth: Payer: Self-pay | Admitting: Family Medicine

## 2022-12-31 DIAGNOSIS — F419 Anxiety disorder, unspecified: Secondary | ICD-10-CM

## 2022-12-31 NOTE — Telephone Encounter (Signed)
[facility] called and spoke to [place], Pensions consultant about the refill(s) Alprazolam requested. Advised it was sent on 12/31/22 showing received, but the patient mentioned it was refused. She says they have it available for pickup. Patient called and advised.

## 2022-12-31 NOTE — Telephone Encounter (Signed)
Medication Refill - Medication: ALPRAZolam (XANAX) 0.5 MG tablet   Has the patient contacted their pharmacy? Yes.   Pharmacy called this morning and advise patient the script was denied.   Preferred Pharmacy (with phone number or street name):  [id number] - [place], [place] - [address] AT [facility] [address] [facility] Phone: [phone]  Fax: [phone]     Has the patient been seen for an appointment in the last year OR does the patient have an upcoming appointment? No.  Agent: Please be advised that RX refills may take up to 3 business days. We ask that you follow-up with your pharmacy.

## 2023-01-25 ENCOUNTER — Other Ambulatory Visit: Payer: Self-pay | Admitting: Family Medicine

## 2023-01-25 DIAGNOSIS — G2581 Restless legs syndrome: Secondary | ICD-10-CM

## 2023-01-25 DIAGNOSIS — F419 Anxiety disorder, unspecified: Secondary | ICD-10-CM

## 2023-01-29 ENCOUNTER — Ambulatory Visit: Admission: RE | Admit: 2023-01-29 | Payer: Federal, State, Local not specified - PPO | Source: Ambulatory Visit

## 2023-02-23 ENCOUNTER — Other Ambulatory Visit: Payer: Self-pay | Admitting: Family Medicine

## 2023-02-23 DIAGNOSIS — G2581 Restless legs syndrome: Secondary | ICD-10-CM

## 2023-02-23 DIAGNOSIS — E7800 Pure hypercholesterolemia, unspecified: Secondary | ICD-10-CM

## 2023-02-23 DIAGNOSIS — F419 Anxiety disorder, unspecified: Secondary | ICD-10-CM

## 2023-02-23 NOTE — Telephone Encounter (Signed)
Requested medications are due for refill today.  yes  Requested medications are on the active medications list.  yes  Last refill. Lovastatin 12/02/2021 #90 4 rf, Xanax and klonopin 01/26/2023 #30 0 rf  Future visit scheduled.   no  Notes to clinic.  2 are not delegated. Lovastatin failed protocol d/t expired labs.    Requested Prescriptions  Pending Prescriptions Disp Refills   ALPRAZolam (XANAX) 0.5 MG tablet [Pharmacy Med Name: ALPRAZOLAM 0.5MG  TABLETS] 30 tablet     Sig: TAKE 1 TABLET(0.5 MG) BY MOUTH EVERY 6 HOURS AS NEEDED FOR ANXIETY     Not Delegated - Psychiatry: Anxiolytics/Hypnotics 2 Failed - 02/23/2023  1:51 PM      Failed - This refill cannot be delegated      Failed - Urine Drug Screen completed in last 360 days      Failed - Valid encounter within last 6 months    Recent Outpatient Visits           9 months ago Sore throat   [place] [facility] [place], [place], PA-C   1 year ago Need for shingles vaccine   [facility] Malva Limes, MD   1 year ago Anxiety   [place] [facility] Malva Limes, MD   2 years ago Anxiety   [place] [facility] Malva Limes, MD   3 years ago Gastroenteritis   [facility] [place], [place], [place]              Passed - Patient is not pregnant       lovastatin (MEVACOR) 20 MG tablet [Pharmacy Med Name: LOVASTATIN 20MG  TABLETS] 90 tablet 4    Sig: TAKE 1 TABLET BY MOUTH DAILY     Cardiovascular:  Antilipid - Statins 2 Failed - 02/23/2023  1:51 PM      Failed - Cr in normal range and within 360 days    Creatinine  Date Value Ref Range Status  11/07/2011 0.69 0.60 - 1.30 mg/dL Final   Creatinine, Ser  Date Value Ref Range Status  11/04/2021 0.96 0.57 - 1.00 mg/dL Final         Failed - Lipid Panel in normal range within the last 12 months    Cholesterol, Total  Date Value Ref Range Status  11/04/2021  195 100 - 199 mg/dL Final   LDL Chol Calc (NIH)  Date Value Ref Range Status  11/04/2021 111 (H) 0 - 99 mg/dL Final   HDL  Date Value Ref Range Status  11/04/2021 71 >39 mg/dL Final   Triglycerides  Date Value Ref Range Status  11/04/2021 74 0 - 149 mg/dL Final         Passed - Patient is not pregnant      Passed - Valid encounter within last 12 months    Recent Outpatient Visits           9 months ago Sore throat    [facility] [place], [place], PA-C   1 year ago Need for shingles vaccine   [facility] Malva Limes, MD   1 year ago Anxiety   [facility] Malva Limes, MD   2 years ago Anxiety   [facility] Malva Limes, MD   3 years ago Gastroenteritis   [facility] [place], [place], [place]  clonazePAM (KLONOPIN) 0.5 MG tablet [Pharmacy Med Name: CLONAZEPAM 0.5MG  TABLETS] 30 tablet     Sig: TAKE 1 TABLET BY MOUTH AT BEDTIME     Not Delegated - Psychiatry: Anxiolytics/Hypnotics 2 Failed - 02/23/2023  1:51 PM      Failed - This refill cannot be delegated      Failed - Urine Drug Screen completed in last 360 days      Failed - Valid encounter within last 6 months    Recent Outpatient Visits           9 months ago Sore throat   [facility] [place], [place], PA-C   1 year ago Need for shingles vaccine   [facility] Malva Limes, MD   1 year ago Anxiety   [facility] Malva Limes, MD   2 years ago Anxiety   [facility] Malva Limes, MD   3 years ago Gastroenteritis   [facility] Joycelyn Man [place], [place]              Passed - Patient is not pregnant

## 2023-03-01 ENCOUNTER — Other Ambulatory Visit: Payer: Self-pay | Admitting: Family Medicine

## 2023-03-01 ENCOUNTER — Encounter: Payer: Self-pay | Admitting: Family Medicine

## 2023-03-01 DIAGNOSIS — F419 Anxiety disorder, unspecified: Secondary | ICD-10-CM

## 2023-03-01 NOTE — Telephone Encounter (Addendum)
Second Request...[facility] for [facility] following medications:   clonazePAM (KLONOPIN) 0.5 MG tablet    ALPRAZolam (XANAX) 0.5 MG tablet    lovastatin (MEVACOR) 20 MG tablet   Please advise

## 2023-03-04 ENCOUNTER — Encounter: Payer: Self-pay | Admitting: Family Medicine

## 2023-03-12 ENCOUNTER — Other Ambulatory Visit: Payer: Self-pay | Admitting: Family Medicine

## 2023-03-12 DIAGNOSIS — F419 Anxiety disorder, unspecified: Secondary | ICD-10-CM

## 2023-03-15 NOTE — Telephone Encounter (Signed)
Requested medication (s) are due for refill today: No  Requested medication (s) are on the active medication list: yes    Last refill: 03/02/23   #30  0 refills  Future visit scheduled yes 03/16/23  Notes to clinic:Not delegated, please review. Thank you.  Requested Prescriptions  Pending Prescriptions Disp Refills   ALPRAZolam (XANAX) 0.5 MG tablet [Pharmacy Med Name: ALPRAZOLAM 0.5MG  TABLETS] 30 tablet     Sig: TAKE 1 TABLET(0.5 MG) BY MOUTH EVERY 6 HOURS AS NEEDED FOR ANXIETY     Not Delegated - Psychiatry: Anxiolytics/Hypnotics 2 Failed - 03/12/2023  3:35 PM      Failed - This refill cannot be delegated      Failed - Urine Drug Screen completed in last 360 days      Failed - Valid encounter within last 6 months    Recent Outpatient Visits           10 months ago Sore throat   [facility] [facility] [place], [place], PA-C   1 year ago Need for shingles vaccine   [facility] Malva Limes, MD   1 year ago Anxiety   [facility] Malva Limes, MD   2 years ago Anxiety   [place] [facility] Malva Limes, MD   3 years ago Gastroenteritis   [facility] [place], [name], [place]       Future Appointments             Tomorrow [place], Monico Blitz, DO [place] [facility]            Passed - Patient is not pregnant

## 2023-03-16 ENCOUNTER — Encounter: Payer: Self-pay | Admitting: Family Medicine

## 2023-03-16 ENCOUNTER — Ambulatory Visit (INDEPENDENT_AMBULATORY_CARE_PROVIDER_SITE_OTHER): Payer: Federal, State, Local not specified - PPO | Admitting: Family Medicine

## 2023-03-16 VITALS — BP 123/80 | HR 79 | Temp 98.8°F | Ht 64.0 in | Wt 119.0 lb

## 2023-03-16 DIAGNOSIS — Z1211 Encounter for screening for malignant neoplasm of colon: Secondary | ICD-10-CM

## 2023-03-16 DIAGNOSIS — F419 Anxiety disorder, unspecified: Secondary | ICD-10-CM | POA: Diagnosis not present

## 2023-03-16 DIAGNOSIS — Z0000 Encounter for general adult medical examination without abnormal findings: Secondary | ICD-10-CM | POA: Diagnosis not present

## 2023-03-16 DIAGNOSIS — Z1231 Encounter for screening mammogram for malignant neoplasm of breast: Secondary | ICD-10-CM

## 2023-03-16 DIAGNOSIS — Z862 Personal history of diseases of the blood and blood-forming organs and certain disorders involving the immune mechanism: Secondary | ICD-10-CM | POA: Diagnosis not present

## 2023-03-16 DIAGNOSIS — F5101 Primary insomnia: Secondary | ICD-10-CM

## 2023-03-16 DIAGNOSIS — F17200 Nicotine dependence, unspecified, uncomplicated: Secondary | ICD-10-CM

## 2023-03-16 DIAGNOSIS — G2581 Restless legs syndrome: Secondary | ICD-10-CM

## 2023-03-16 MED ORDER — DOXEPIN HCL 25 MG PO CAPS: 25.0000 mg | ORAL_CAPSULE | Freq: Every day | ORAL | 1 refills | Status: DC

## 2023-03-16 MED ORDER — CLONAZEPAM 0.5 MG PO TABS: 0.5000 mg | ORAL_TABLET | Freq: Every day | ORAL | 0 refills | Status: DC

## 2023-03-16 MED ORDER — ALPRAZOLAM 0.5 MG PO TABS: 0.5000 mg | ORAL_TABLET | Freq: Two times a day (BID) | ORAL | 0 refills | Status: DC | PRN

## 2023-03-16 NOTE — Progress Notes (Signed)
Established patient visit   Patient: Kristen Wright   DOB: 1961-03-26   61 y.o. Female  MRN: [id number] Visit Date: 03/16/2023  Today's healthcare provider: Sherlyn Hay, DO   Chief Complaint  Patient presents with   Anxiety   Subjective    HPI  Anxiety, Follow-up  She was last seen for anxiety 1 years ago. Changes made at last visit include none.   Symptoms: No chest pain No difficulty concentrating  No dizziness No fatigue  No feelings of losing control Yes insomnia  Yes irritable No palpitations  Yes panic attacks Yes racing thoughts  No shortness of breath No sweating  No tremors/shakes     PHQ-9 Scores    03/16/2023    3:18 PM 05/14/2022    9:14 AM 11/04/2021    9:46 AM  PHQ9 SCORE ONLY  PHQ-9 Total Score 13 17 15    Tobacco cessation: Has a fear of quitting because people she's knows quit smoking and got cancer/died 8 years later.  Insomnia: Hard time falling asleep at night Takes 1 klonopam, 2 alprazolam and her lovastatin all together at night every night. Invariably wakes up in the middle of the night. Varies between 1-4 hours of sleep before wake-ups. Wakes up 2-3 times per night. Will go to the bathroom or walk through the house, then goes back to sleep. Does take a while to go back to sleep. Has been on current meds for twelve years.  Previous meds tried for anxiety: Buproprion, trazodone, escitalopram, mirtazapine, ambien Numbness and tingling in right foot since two surgeries were done Trigger finger 3rd right digit.  ---------------------------------------------------------------------------------------------------   Medications: Outpatient Medications Prior to Visit  Medication Sig   lovastatin (MEVACOR) 20 MG tablet Take 1 tablet (20 mg total) by mouth daily. Please schedule office visit before any future refill.   [DISCONTINUED] ALPRAZolam (XANAX) 0.5 MG tablet TAKE 1 TABLET(0.5 MG) BY MOUTH EVERY 6 HOURS AS NEEDED FOR ANXIETY    [DISCONTINUED] clonazePAM (KLONOPIN) 0.5 MG tablet TAKE 1 TABLET BY MOUTH AT BEDTIME   [DISCONTINUED] zolpidem (AMBIEN) 5 MG tablet TAKE 1 TABLET(5 MG) BY MOUTH AT BEDTIME AS NEEDED FOR SLEEP   No facility-administered medications prior to visit.    Review of Systems  Constitutional:  Negative for appetite change, chills, fatigue and fever.  Eyes:  Negative for visual disturbance.  Respiratory:  Negative for chest tightness and shortness of breath.   Cardiovascular:  Negative for chest pain and palpitations.  Gastrointestinal:  Negative for abdominal pain, nausea and vomiting.  Neurological:  Negative for dizziness, weakness, light-headedness and headaches.  Psychiatric/Behavioral:  Positive for sleep disturbance. Negative for self-injury and suicidal ideas (denies HI as well). The patient is nervous/anxious.        Objective    BP 123/80 (BP Location: Left Arm, Patient Position: Sitting, Cuff Size: Normal)   Pulse 79   Temp 98.8 F (37.1 C) (Oral)   Ht 5\' 4"  (1.626 m)   Wt 119 lb (54 kg)   SpO2 97%   BMI 20.43 kg/m    Physical Exam Vitals reviewed.  Constitutional:      General: She is not in acute distress.    Appearance: Normal appearance. She is well-developed. She is not diaphoretic.  HENT:     Head: Normocephalic and atraumatic.     Right Ear: Tympanic membrane, ear canal and external ear normal.     Left Ear: Tympanic membrane, ear canal and external ear normal.  Nose: Nose normal.     Mouth/Throat:     Mouth: Mucous membranes are moist.     Pharynx: Oropharynx is clear. No oropharyngeal exudate.  Eyes:     General: No scleral icterus.    Conjunctiva/sclera: Conjunctivae normal.     Pupils: Pupils are equal, round, and reactive to light.  Neck:     Thyroid: No thyromegaly.  Cardiovascular:     Rate and Rhythm: Normal rate and regular rhythm.     Pulses: Normal pulses.     Heart sounds: Normal heart sounds. No murmur heard. Pulmonary:     Effort:  Pulmonary effort is normal. No respiratory distress.     Breath sounds: Normal breath sounds. No wheezing or rales.  Abdominal:     General: There is no distension.     Palpations: Abdomen is soft.     Tenderness: There is no abdominal tenderness.  Musculoskeletal:        General: No deformity.     Cervical back: Neck supple.     Right lower leg: No edema.     Left lower leg: No edema.  Lymphadenopathy:     Cervical: No cervical adenopathy.  Skin:    General: Skin is warm and dry.     Findings: No rash.  Neurological:     Mental Status: She is alert and oriented to person, place, and time. Mental status is at baseline.     Gait: Gait normal.  Psychiatric:        Mood and Affect: Mood normal.        Behavior: Behavior normal.        Thought Content: Thought content normal.      Results for orders placed or performed in visit on 03/16/23  Comprehensive metabolic panel  Result Value Ref Range   Glucose 83 70 - 99 mg/dL   BUN 11 6 - 24 mg/dL   Creatinine, Ser 1.91 0.57 - 1.00 mg/dL   eGFR 72 >47 WG/NFA/2.13   BUN/Creatinine Ratio 12 9 - 23   Sodium 139 134 - 144 mmol/L   Potassium 4.7 3.5 - 5.2 mmol/L   Chloride 101 96 - 106 mmol/L   CO2 24 20 - 29 mmol/L   Calcium 9.9 8.7 - 10.2 mg/dL   Total Protein 7.0 6.0 - 8.5 g/dL   Albumin 4.7 3.8 - 4.9 g/dL   Globulin, Total 2.3 1.5 - 4.5 g/dL   Bilirubin Total 0.4 0.0 - 1.2 mg/dL   Alkaline Phosphatase 81 44 - 121 IU/L   AST 20 0 - 40 IU/L   ALT 13 0 - 32 IU/L  Lipid panel  Result Value Ref Range   Cholesterol, Total 183 100 - 199 mg/dL   Triglycerides 91 0 - 149 mg/dL   HDL 70 >08 mg/dL   VLDL Cholesterol Cal 16 5 - 40 mg/dL   LDL Chol Calc (NIH) 97 0 - 99 mg/dL   Chol/HDL Ratio 2.6 0.0 - 4.4 ratio  TSH Rfx on Abnormal to Free T4  Result Value Ref Range   TSH 0.908 0.450 - 4.500 uIU/mL  TSH + free T4  Result Value Ref Range   TSH CANCELED uIU/mL   Free T4 1.38 0.82 - 1.77 ng/dL  CBC  Result Value Ref Range   WBC  7.8 3.4 - 10.8 x10E3/uL   RBC 4.49 3.77 - 5.28 x10E6/uL   Hemoglobin 13.6 11.1 - 15.9 g/dL   Hematocrit 65.7 84.6 - 46.6 %   MCV 91  79 - 97 fL   MCH 30.3 26.6 - 33.0 pg   MCHC 33.3 31.5 - 35.7 g/dL   RDW 40.9 81.1 - 91.4 %   Platelets 235 150 - 450 x10E3/uL  HIV Antibody (routine testing w rflx)  Result Value Ref Range   HIV Screen 4th Generation wRfx Non Reactive Non Reactive    Assessment & Plan    1. Primary insomnia Patient has significant difficulty sleeping.  Prescribed doxepin to facilitate sleep maintenance, as the alprazolam may help her fall asleep by reducing her anxiety some but does not help her to maintain sleep.  Discussed with patient that the goal is to reduce her use and dependence on alprazolam.  Did renew alprazolam for the time being to allow a slow change over. - doxepin (SINEQUAN) 25 MG capsule; Take 1 capsule (25 mg total) by mouth at bedtime.  Dispense: 30 capsule; Refill: 1  2. Anxiety Patient has significant anxiety which contributes to her inability to sleep.  Discussed with her that continuing approximately them long-term puts her at significant risk for unintentional overdose, as the body filters out less and less of the medication as we age.  Prescribed her doxepin with the understanding that she would be trying to reduce her dependence on Xanax for sleep.  Did discuss alternative options for long-term anxiety control; patient was not receptive to trying one today. Will have to revisit this at next visit. - doxepin (SINEQUAN) 25 MG capsule; Take 1 capsule (25 mg total) by mouth at bedtime.  Dispense: 30 capsule; Refill: 1 - ALPRAZolam (XANAX) 0.5 MG tablet; Take 1 tablet (0.5 mg total) by mouth 2 (two) times daily as needed for anxiety.  Dispense: 60 tablet; Refill: 0  3. Annual physical exam Overall benign annual physical exam as noted.  Will check routine blood work as noted below. - Comprehensive metabolic panel - Lipid panel - TSH Rfx on Abnormal to Free  T4 - TSH + free T4 - HIV Antibody (routine testing w rflx)  4. Nicotine dependence with current use Patient is unwilling to quit cigarettes at this time due to her fear relating to other people developing cancer after quitting.  Ordered low-dose CT scan to screen for lung cancer as noted below. - Low Dose CT Chest w/o Contrast for Lung Cancer Screening [[id number]]; Future  5. Encounter for screening colonoscopy - Ambulatory referral to Gastroenterology  6. Encounter for screening mammogram for breast cancer Screening mammogram ordered as patient is overdue.  7. History of anemia - CBC  8. Restless leg syndrome Patient has longstanding history of restless leg syndrome, for which she has been taking clonazepam over the last 12 years. - clonazePAM (KLONOPIN) 0.5 MG tablet; Take 1 tablet (0.5 mg total) by mouth at bedtime.  Dispense: 30 tablet; Refill: 0   Total time was 60 minutes. That includes chart review before the visit, the actual patient visit, and time spent on documentation after the visit.  This does not include time spent performing the physical exam.    Return in about 2 weeks (around 03/30/2023) for Anx/Dep/Ins.      The entirety of the information documented in the History of Present Illness, Review of Systems and Physical Exam were personally obtained by me. Portions of this information were initially documented by the CMA, Adline Peals, and reviewed by me for thoroughness and accuracy.   I discussed the assessment and treatment plan with the patient  The patient was provided an opportunity to ask questions  and all were answered. The patient agreed with the plan and demonstrated an understanding of the instructions.   The patient was advised to call back or seek an in-person evaluation if the symptoms worsen or if the condition fails to improve as anticipated.    Sherlyn Hay, DO  [facility] [phone] (phone) [phone]  (fax)  [facility]

## 2023-03-17 LAB — COMPREHENSIVE METABOLIC PANEL
ALT: 13 IU/L (ref 0–32)
AST: 20 IU/L (ref 0–40)
Albumin: 4.7 g/dL (ref 3.8–4.9)
Alkaline Phosphatase: 81 IU/L (ref 44–121)
BUN/Creatinine Ratio: 12 (ref 9–23)
BUN: 11 mg/dL (ref 6–24)
Bilirubin Total: 0.4 mg/dL (ref 0.0–1.2)
CO2: 24 mmol/L (ref 20–29)
Calcium: 9.9 mg/dL (ref 8.7–10.2)
Chloride: 101 mmol/L (ref 96–106)
Creatinine, Ser: 0.92 mg/dL (ref 0.57–1.00)
Globulin, Total: 2.3 g/dL (ref 1.5–4.5)
Glucose: 83 mg/dL (ref 70–99)
Potassium: 4.7 mmol/L (ref 3.5–5.2)
Sodium: 139 mmol/L (ref 134–144)
Total Protein: 7 g/dL (ref 6.0–8.5)
eGFR: 72 mL/min/{1.73_m2} (ref >59)

## 2023-03-17 LAB — CBC
Hematocrit: 40.9 % (ref 34.0–46.6)
Hemoglobin: 13.6 g/dL (ref 11.1–15.9)
MCH: 30.3 pg (ref 26.6–33.0)
MCHC: 33.3 g/dL (ref 31.5–35.7)
MCV: 91 fL (ref 79–97)
Platelets: 235 10*3/uL (ref 150–450)
RBC: 4.49 x10E6/uL (ref 3.77–5.28)
RDW: 12.1 % (ref 11.7–15.4)
WBC: 7.8 10*3/uL (ref 3.4–10.8)

## 2023-03-17 LAB — LIPID PANEL
Chol/HDL Ratio: 2.6 ratio (ref 0.0–4.4)
Cholesterol, Total: 183 mg/dL (ref 100–199)
HDL: 70 mg/dL (ref >39)
LDL Chol Calc (NIH): 97 mg/dL (ref 0–99)
Triglycerides: 91 mg/dL (ref 0–149)
VLDL Cholesterol Cal: 16 mg/dL (ref 5–40)

## 2023-03-17 LAB — TSH+FREE T4: Free T4: 1.38 ng/dL (ref 0.82–1.77)

## 2023-03-17 LAB — HIV ANTIBODY (ROUTINE TESTING W REFLEX): HIV Screen 4th Generation wRfx: NONREACTIVE

## 2023-03-17 LAB — TSH RFX ON ABNORMAL TO FREE T4: TSH: 0.908 u[IU]/mL (ref 0.450–4.500)

## 2023-03-18 ENCOUNTER — Telehealth: Payer: Self-pay

## 2023-03-18 ENCOUNTER — Other Ambulatory Visit: Payer: Self-pay

## 2023-03-18 DIAGNOSIS — Z1211 Encounter for screening for malignant neoplasm of colon: Secondary | ICD-10-CM

## 2023-03-18 MED ORDER — NA SULFATE-K SULFATE-MG SULF 17.5-3.13-1.6 GM/177ML PO SOLN: 1.0000 | Freq: Once | ORAL | 0 refills | Status: AC

## 2023-03-18 NOTE — Telephone Encounter (Signed)
Gastroenterology Pre-Procedure Review  Request Date: 04/29/23 Requesting Physician: Dr. Tobi Bastos  PATIENT REVIEW QUESTIONS: The patient responded to the following health history questions as indicated:    1. Are you having any GI issues? no 2. Do you have a personal history of Polyps? no 3. Do you have a family history of Colon Cancer or Polyps? no 4. Diabetes Mellitus? no 5. Joint replacements in the past 12 months?no 6. Major health problems in the past 3 months?no 7. Any artificial heart valves, MVP, or defibrillator?no    MEDICATIONS & ALLERGIES:    Patient reports the following regarding taking any anticoagulation/antiplatelet therapy:   Plavix, Coumadin, Eliquis, Xarelto, Lovenox, Pradaxa, Brilinta, or Effient? no Aspirin? no  Patient confirms/reports the following medications:  Current Outpatient Medications  Medication Sig Dispense Refill   ALPRAZolam (XANAX) 0.5 MG tablet Take 1 tablet (0.5 mg total) by mouth 2 (two) times daily as needed for anxiety. 60 tablet 0   clonazePAM (KLONOPIN) 0.5 MG tablet Take 1 tablet (0.5 mg total) by mouth at bedtime. 30 tablet 0   doxepin (SINEQUAN) 25 MG capsule Take 1 capsule (25 mg total) by mouth at bedtime. 30 capsule 1   lovastatin (MEVACOR) 20 MG tablet Take 1 tablet (20 mg total) by mouth daily. Please schedule office visit before any future refill. 30 tablet 0   No current facility-administered medications for this visit.    Patient confirms/reports the following allergies:  Allergies  Allergen Reactions   Mirtazapine     Nightmares    No orders of the defined types were placed in this encounter.   AUTHORIZATION INFORMATION Primary Insurance: 1D#: Group #:  Secondary Insurance: 1D#: Group #:  SCHEDULE INFORMATION: Date: 04/29/23 Time: Location: [facility]

## 2023-03-18 NOTE — Telephone Encounter (Signed)
Pt left message to schedule colonoscopy please return call  

## 2023-03-28 ENCOUNTER — Other Ambulatory Visit: Payer: Self-pay | Admitting: Family Medicine

## 2023-03-28 DIAGNOSIS — E7800 Pure hypercholesterolemia, unspecified: Secondary | ICD-10-CM

## 2023-03-29 ENCOUNTER — Other Ambulatory Visit: Payer: Self-pay

## 2023-03-29 ENCOUNTER — Telehealth: Payer: Self-pay | Admitting: Family Medicine

## 2023-03-29 NOTE — Telephone Encounter (Signed)
Requested Prescriptions  Pending Prescriptions Disp Refills   lovastatin (MEVACOR) 20 MG tablet [Pharmacy Med Name: LOVASTATIN 20MG  TABLETS] 90 tablet 3    Sig: TAKE 1 TABLET(20 MG) BY MOUTH DAILY     Cardiovascular:  Antilipid - Statins 2 Failed - 03/28/2023  7:01 AM      Failed - Lipid Panel in normal range within the last 12 months    Cholesterol, Total  Date Value Ref Range Status  03/16/2023 183 100 - 199 mg/dL Final   LDL Chol Calc (NIH)  Date Value Ref Range Status  03/16/2023 97 0 - 99 mg/dL Final   HDL  Date Value Ref Range Status  03/16/2023 70 >39 mg/dL Final   Triglycerides  Date Value Ref Range Status  03/16/2023 91 0 - 149 mg/dL Final         Passed - Cr in normal range and within 360 days    Creatinine  Date Value Ref Range Status  11/07/2011 0.69 0.60 - 1.30 mg/dL Final   Creatinine, Ser  Date Value Ref Range Status  03/16/2023 0.92 0.57 - 1.00 mg/dL Final         Passed - Patient is not pregnant      Passed - Valid encounter within last 12 months    Recent Outpatient Visits           1 week ago Primary insomnia   [facility] Sherlyn Hay, DO   10 months ago Sore throat   [facility] [place], [place], PA-C   1 year ago Need for shingles vaccine   [facility] Malva Limes, MD   1 year ago Anxiety   [facility] Malva Limes, MD   2 years ago Anxiety   [facility] Malva Limes, MD       Future Appointments             In 2 weeks Fisher, Demetrios Isaacs, MD [facility]

## 2023-03-29 NOTE — Telephone Encounter (Signed)
[facility] is requesting prescription refill lovastatin (MEVACOR) 20 MG tablet   Please advise

## 2023-04-06 ENCOUNTER — Ambulatory Visit
Admission: RE | Admit: 2023-04-06 | Discharge: 2023-04-06 | Disposition: A | Discharge status: Home / Self Care | Payer: Federal, State, Local not specified - PPO | Source: Ambulatory Visit | Attending: Family Medicine | Admitting: Family Medicine

## 2023-04-06 DIAGNOSIS — F17200 Nicotine dependence, unspecified, uncomplicated: Secondary | ICD-10-CM | POA: Insufficient documentation

## 2023-04-06 DIAGNOSIS — F17210 Nicotine dependence, cigarettes, uncomplicated: Secondary | ICD-10-CM | POA: Diagnosis not present

## 2023-04-12 ENCOUNTER — Encounter: Payer: Self-pay | Admitting: Family Medicine

## 2023-04-12 ENCOUNTER — Other Ambulatory Visit: Payer: Self-pay | Admitting: Family Medicine

## 2023-04-12 ENCOUNTER — Ambulatory Visit: Payer: Federal, State, Local not specified - PPO | Admitting: Family Medicine

## 2023-04-12 VITALS — BP 139/87 | HR 53 | Temp 97.9°F | Resp 16 | Ht 64.0 in | Wt 122.9 lb

## 2023-04-12 DIAGNOSIS — F419 Anxiety disorder, unspecified: Secondary | ICD-10-CM

## 2023-04-12 DIAGNOSIS — F17210 Nicotine dependence, cigarettes, uncomplicated: Secondary | ICD-10-CM

## 2023-04-12 DIAGNOSIS — Z1231 Encounter for screening mammogram for malignant neoplasm of breast: Secondary | ICD-10-CM | POA: Diagnosis not present

## 2023-04-12 DIAGNOSIS — G2581 Restless legs syndrome: Secondary | ICD-10-CM | POA: Diagnosis not present

## 2023-04-12 MED ORDER — ALPRAZOLAM 0.5 MG PO TABS: 0.5000 mg | ORAL_TABLET | Freq: Two times a day (BID) | ORAL | 3 refills | Status: DC | PRN

## 2023-04-12 MED ORDER — CLONAZEPAM 0.5 MG PO TABS
0.5000 mg | ORAL_TABLET | Freq: Every day | ORAL | 5 refills | Status: DC
Start: 2023-04-12 — End: 2023-09-28

## 2023-04-12 NOTE — Progress Notes (Signed)
Established patient visit   Patient: Kristen Wright   DOB: June 04, 1961   62 y.o. Female  MRN: [id number] Visit Date: 04/12/2023  Today's healthcare provider: Mila Merry, MD   Chief Complaint  Patient presents with   Anxiety   Insomnia   Subjective    Discussed the use of AI scribe software for clinical note transcription with the patient, who gave verbal consent to proceed.  History of Present Illness   The patient, with a history of anxiety and insomnia, presents with ongoing sleep disturbances. She had been taking clonazepam 0.5 and 2 x alprazolam 0.5 every night for the last few years until her CPE last month at which time she was prescribed 25 Doxepin hs and advised to wean alprazolam. She reports difficulty maintaining sleep, often waking up two to three times per night, and sometimes more. This pattern has improved with addition of doxepin to their regimen, which initially seemed to improve her sleep continuity. However, the patient has recently run out of clonazepam, which she was taking once nightly, and since then, her sleep disturbances have returned. She has been compensating by increasing her dose of alprazolam to two tablets at night, in addition to taking lovastatin.  The patient also reports occasional cramping in the foot that she previously had surgery on, describing it as feeling like a charley horse. She has not noticed any nocturnal leg shaking recently. She has been managing her anxiety with alprazolam as needed, often taking it during the day when she feels anxious.      Medications: Outpatient Medications Prior to Visit  Medication Sig   doxepin (SINEQUAN) 25 MG capsule Take 1 capsule (25 mg total) by mouth at bedtime.   lovastatin (MEVACOR) 20 MG tablet TAKE 1 TABLET(20 MG) BY MOUTH DAILY   [DISCONTINUED] ALPRAZolam (XANAX) 0.5 MG tablet Take 1 tablet (0.5 mg total) by mouth 2 (two) times daily as needed for anxiety.   [DISCONTINUED] clonazePAM  (KLONOPIN) 0.5 MG tablet Take 1 tablet (0.5 mg total) by mouth at bedtime.   No facility-administered medications prior to visit.   Review of Systems       Objective    BP 139/87 (BP Location: Left Arm, Patient Position: Sitting, Cuff Size: Normal)   Pulse (!) 53   Temp 97.9 F (36.6 C) (Temporal)   Resp 16   Ht 5\' 4"  (1.626 m)   Wt 122 lb 14.4 oz (55.7 kg)   SpO2 100%   BMI 21.10 kg/m      Physical Exam  Physical Exam        No results found for any visits on 04/12/23.  Assessment & Plan     Assessment and Plan    Insomnia: Difficulty maintaining sleep despite multiple medications (Alprazolam, Clonazepam, Lovastatin, Doxepin). Initial improvement with addition of Doxepin, but worsened sleep after attempting to wean off Alprazolam and Clonazepam. -Continue Doxepin as it seemed to help initially. -Resume Clonazepam 0.25mg  nightly due to its longer duration of action and potential benefit for restless legs. -Consider reducing Alprazolam to 1 tablet at bedtime and an additional half tablet as needed for wakefulness in the middle of the night.  Restless Legs Syndrome: Possible exacerbation since discontinuation of Clonazepam. -Resume Clonazepam 0.25mg  nightly.  Prescription Refills: -Refill Clonazepam and Alprazolam prescriptions. Send to [facility] in [place].          Mila Merry, MD  [facility] [phone] (phone) [phone] (fax)  [facility]

## 2023-04-12 NOTE — Patient Instructions (Signed)
Please review the attached list of medications and notify my office if there are any errors.    

## 2023-04-22 ENCOUNTER — Encounter: Payer: Self-pay | Admitting: Gastroenterology

## 2023-04-29 ENCOUNTER — Encounter
Admission: RE | Disposition: A | Discharge status: Home / Self Care | Payer: Self-pay | Source: Home / Self Care | Attending: Gastroenterology

## 2023-04-29 ENCOUNTER — Ambulatory Visit: Payer: Federal, State, Local not specified - PPO | Admitting: Anesthesiology

## 2023-04-29 ENCOUNTER — Encounter: Payer: Self-pay | Admitting: Gastroenterology

## 2023-04-29 ENCOUNTER — Ambulatory Visit
Admission: RE | Admit: 2023-04-29 | Discharge: 2023-04-29 | Disposition: A | Discharge status: Home / Self Care | Payer: Federal, State, Local not specified - PPO | Attending: Gastroenterology | Admitting: Gastroenterology

## 2023-04-29 DIAGNOSIS — D125 Benign neoplasm of sigmoid colon: Secondary | ICD-10-CM | POA: Diagnosis not present

## 2023-04-29 DIAGNOSIS — K635 Polyp of colon: Secondary | ICD-10-CM | POA: Diagnosis not present

## 2023-04-29 DIAGNOSIS — F17210 Nicotine dependence, cigarettes, uncomplicated: Secondary | ICD-10-CM | POA: Insufficient documentation

## 2023-04-29 DIAGNOSIS — D126 Benign neoplasm of colon, unspecified: Secondary | ICD-10-CM

## 2023-04-29 DIAGNOSIS — Z1211 Encounter for screening for malignant neoplasm of colon: Secondary | ICD-10-CM | POA: Diagnosis not present

## 2023-04-29 DIAGNOSIS — I2510 Atherosclerotic heart disease of native coronary artery without angina pectoris: Secondary | ICD-10-CM | POA: Diagnosis not present

## 2023-04-29 HISTORY — PX: POLYPECTOMY: SHX5525

## 2023-04-29 HISTORY — PX: COLONOSCOPY WITH PROPOFOL: SHX5780

## 2023-04-29 SURGERY — COLONOSCOPY WITH PROPOFOL
Anesthesia: General

## 2023-04-29 MED ORDER — PROPOFOL 500 MG/50ML IV EMUL
INTRAVENOUS | Status: DC | PRN
  Administered 2023-04-29: 140 ug/kg/min via INTRAVENOUS

## 2023-04-29 MED ORDER — SODIUM CHLORIDE 0.9 % IV SOLN: INTRAVENOUS | Status: DC

## 2023-04-29 MED ORDER — LIDOCAINE HCL (CARDIAC) PF 100 MG/5ML IV SOSY
PREFILLED_SYRINGE | INTRAVENOUS | Status: DC | PRN
  Administered 2023-04-29: 50 mg via INTRAVENOUS

## 2023-04-29 MED ORDER — PROPOFOL 10 MG/ML IV BOLUS
INTRAVENOUS | Status: DC | PRN
Start: 2023-04-29 — End: 2023-04-29
  Administered 2023-04-29: 60 mg via INTRAVENOUS
  Administered 2023-04-29 (×2): 20 mg via INTRAVENOUS

## 2023-04-29 NOTE — H&P (Signed)
Wyline Mood, MD [address], [place], [place], [address] [address], [address], [place], [place], [address] Phone: [phone]  Fax: [phone]  Primary Care Physician:  Malva Limes, MD   Pre-Procedure History & Physical: HPI:  Kristen Wright is a 62 y.o. female is here for an colonoscopy.   Past Medical History:  Diagnosis Date   History of chicken pox    History of mumps     Past Surgical History:  Procedure Laterality Date   BUNIONECTOMY Left 12/02/2018   Dr. Al Corpus   CT Scan of Abdomen/ Pelvis  08/25/2011   Without Contrast- Normal   DILATION AND CURETTAGE OF UTERUS  08/2010   due to excessive bleeding   MRI Abdomen  08/31/2012   Findings consistent with small cyst in right kidney. Other small densities in both kidneys likely representing cysts   MRI Lumbar spine  12/17/2012   Possible disc fragment compressing T12 and L1 nerve root   TOTAL LAPAROSCOPIC HYSTERECTOMY WITH SALPINGECTOMY  11/06/2011   Uterine mass by Dr. Janene Harvey & Luella Cook. No cervix per exam Dr. Algis Downs 06-03-2012    Prior to Admission medications   Medication Sig Start Date End Date Taking? Authorizing Provider  doxepin (SINEQUAN) 25 MG capsule Take 1 capsule (25 mg total) by mouth at bedtime. 03/16/23  Yes Pardue, Monico Blitz, DO  lovastatin (MEVACOR) 20 MG tablet TAKE 1 TABLET(20 MG) BY MOUTH DAILY 03/29/23  Yes Malva Limes, MD  ALPRAZolam Prudy Feeler) 0.5 MG tablet Take 1 tablet (0.5 mg total) by mouth 2 (two) times daily as needed for anxiety. 04/12/23   Malva Limes, MD  clonazePAM (KLONOPIN) 0.5 MG tablet Take 1 tablet (0.5 mg total) by mouth at bedtime. 04/12/23   Malva Limes, MD    Allergies as of 03/18/2023 - Review Complete 03/18/2023  Allergen Reaction Noted   Mirtazapine  06/10/2018    Family History  Problem Relation Age of Onset   Heart attack Father    Parkinson's disease Sister    Hyperlipidemia Brother    Cancer Brother    CAD Other    Heart attack Other      Social History   Socioeconomic History   Marital status: Widowed    Spouse name: Not on file   Number of children: 4   Years of education: Not on file   Highest education level: Not on file  Occupational History   Occupation: [employer]  Tobacco Use   Smoking status: Every Day    Current packs/day: 1.00    Average packs/day: 1 pack/day for 44.6 years (44.6 ttl pk-yrs)    Types: Cigarettes    Start date: 24   Smokeless tobacco: Never  Vaping Use   Vaping status: Never Used  Substance and Sexual Activity   Alcohol use: Yes    Alcohol/week: 0.0 standard drinks of alcohol    Comment: once a month   Drug use: No   Sexual activity: Not on file  Other Topics Concern   Not on file  Social History Narrative   Husband died of lung cancer in May 21, 2011   Has 4 biological children and 2 step children   Social Determinants of Health   Financial Resource Strain: Not on file  Food Insecurity: Not on file  Transportation Needs: Not on file  Physical Activity: Not on file  Stress: Not on file  Social Connections: Not on file  Intimate Partner Violence: Not on file  Review of Systems: See HPI, otherwise negative ROS  Physical Exam: BP (!) 136/91   Pulse 74   Temp 97.6 F (36.4 C) (Temporal)   Resp 16   Wt 55.7 kg   SpO2 96%   BMI 21.08 kg/m  General:   Alert,  pleasant and cooperative in NAD Head:  Normocephalic and atraumatic. Neck:  Supple; no masses or thyromegaly. Lungs:  Clear throughout to auscultation, normal respiratory effort.    Heart:  +S1, +S2, Regular rate and rhythm, No edema. Abdomen:  Soft, nontender and nondistended. Normal bowel sounds, without guarding, and without rebound.   Neurologic:  Alert and  oriented x4;  grossly normal neurologically.  Impression/Plan: Kristen Wright is here for an colonoscopy to be performed for Screening colonoscopy average risk   Risks, benefits, limitations, and alternatives regarding  colonoscopy have been  reviewed with the patient.  Questions have been answered.  All parties agreeable.   Wyline Mood, MD  04/29/2023, 10:24 AM

## 2023-04-29 NOTE — Transfer of Care (Signed)
Immediate Anesthesia Transfer of Care Note  Patient: Kristen Wright  Procedure(s) Performed: COLONOSCOPY WITH PROPOFOL POLYPECTOMY  Patient Location: Endoscopy Unit  Anesthesia Type:General  Level of Consciousness: drowsy  Airway & Oxygen Therapy: Patient Spontanous Breathing  Post-op Assessment: Report given to RN and Post -op Vital signs reviewed and stable  Post vital signs: Reviewed and stable  Last Vitals:  Vitals Value Taken Time  BP 107/72   Temp    Pulse 75 04/29/23 1043  Resp 23 04/29/23 1043  SpO2 96 % 04/29/23 1043  Vitals shown include unfiled device data.  Last Pain:  Vitals:   04/29/23 0912  TempSrc: Temporal  PainSc: 0-No pain         Complications: No notable events documented.

## 2023-04-29 NOTE — Anesthesia Postprocedure Evaluation (Signed)
Anesthesia Post Note  Patient: Kristen Wright  Procedure(s) Performed: COLONOSCOPY WITH PROPOFOL POLYPECTOMY  Patient location during evaluation: PACU Anesthesia Type: General Level of consciousness: awake Pain management: pain level controlled Vital Signs Assessment: post-procedure vital signs reviewed and stable Respiratory status: nonlabored ventilation and spontaneous breathing Cardiovascular status: blood pressure returned to baseline Anesthetic complications: no   No notable events documented.   Last Vitals:  Vitals:   04/29/23 0912  BP: (!) 136/91  Pulse: 74  Resp: 16  Temp: 36.4 C  SpO2: 96%    Last Pain:  Vitals:   04/29/23 0912  TempSrc: Temporal  PainSc: 0-No pain                 VAN STAVEREN,Rejeana Fadness

## 2023-04-29 NOTE — Anesthesia Preprocedure Evaluation (Signed)
Anesthesia Evaluation  Patient identified by MRN, date of birth, ID band Patient awake    Reviewed: Allergy & Precautions, NPO status , Patient's Chart, lab work & pertinent test results  Airway Mallampati: III  TM Distance: <3 FB Neck ROM: full    Dental  (+) Partial Lower, Partial Upper   Pulmonary neg pulmonary ROS, COPD, Current Smoker   Pulmonary exam normal  + decreased breath sounds      Cardiovascular Exercise Tolerance: Good negative cardio ROS Normal cardiovascular exam Rhythm:Regular Rate:Normal     Neuro/Psych    Depression    negative neurological ROS  negative psych ROS   GI/Hepatic negative GI ROS, Neg liver ROS,,,  Endo/Other  negative endocrine ROS    Renal/GU negative Renal ROS  negative genitourinary   Musculoskeletal   Abdominal Normal abdominal exam  (+)   Peds negative pediatric ROS (+)  Hematology negative hematology ROS (+)   Anesthesia Other Findings Past Medical History: No date: History of chicken pox No date: History of mumps  Past Surgical History: 12/02/2018: BUNIONECTOMY; Left     Comment:  Dr. Al Corpus 08/25/2011: CT Scan of Abdomen/ Pelvis     Comment:  Without Contrast- Normal 08/2010: DILATION AND CURETTAGE OF UTERUS     Comment:  due to excessive bleeding 08/31/2012: MRI Abdomen     Comment:  Findings consistent with small cyst in right kidney.               Other small densities in both kidneys likely representing              cysts 12/17/2012: MRI Lumbar spine     Comment:  Possible disc fragment compressing T12 and L1 nerve root 11/06/2011: TOTAL LAPAROSCOPIC HYSTERECTOMY WITH SALPINGECTOMY     Comment:  Uterine mass by Dr. Janene Harvey & Luella Cook. No cervix per exam               Dr. Algis Downs 06-03-2012  BMI    Body Mass Index: 21.08 kg/m      Reproductive/Obstetrics negative OB ROS                             Anesthesia Physical Anesthesia  Plan  ASA: 3  Anesthesia Plan: General   Post-op Pain Management:    Induction: Intravenous  PONV Risk Score and Plan: Propofol infusion and TIVA  Airway Management Planned: Natural Airway  Additional Equipment:   Intra-op Plan:   Post-operative Plan:   Informed Consent: I have reviewed the patients History and Physical, chart, labs and discussed the procedure including the risks, benefits and alternatives for the proposed anesthesia with the patient or authorized representative who has indicated his/her understanding and acceptance.     Dental Advisory Given  Plan Discussed with: CRNA and Surgeon  Anesthesia Plan Comments:        Anesthesia Quick Evaluation

## 2023-04-29 NOTE — Op Note (Signed)
[facility] Gastroenterology Patient Name: Kristen Wright Procedure Date: 04/29/2023 10:22 AM MRN: [id number] Account #: [id number] Date of Birth: 27-Sep-1960 Admit Type: Outpatient Age: 62 Room: [facility] ENDO ROOM 3 Gender: Female Note Status: Finalized Instrument Name: Prentice Docker [id number] Procedure:             Colonoscopy Indications:           Screening for colorectal malignant neoplasm Providers:             Wyline Mood MD, MD Referring MD:          Demetrios Isaacs. Sherrie Mustache, MD (Referring MD) Medicines:             Monitored Anesthesia Care Complications:         No immediate complications. Procedure:             Pre-Anesthesia Assessment:                        - Prior to the procedure, a History and Physical was                         performed, and patient medications, allergies and                         sensitivities were reviewed. The patient's tolerance                         of previous anesthesia was reviewed.                        - The risks and benefits of the procedure and the                         sedation options and risks were discussed with the                         patient. All questions were answered and informed                         consent was obtained.                        - ASA Grade Assessment: II - A patient with mild                         systemic disease.                        After obtaining informed consent, the colonoscope was                         passed under direct vision. Throughout the procedure,                         the patient's blood pressure, pulse, and oxygen                         saturations were monitored continuously. The                         Colonoscope was  introduced through the anus and                         advanced to the the cecum, identified by the                         appendiceal orifice. The colonoscopy was performed                         with ease. The patient tolerated the procedure  well.                         The quality of the bowel preparation was good. The                         ileocecal valve, appendiceal orifice, and rectum were                         photographed. Findings:      The perianal and digital rectal examinations were normal.      Two sessile polyps were found in the sigmoid colon. The polyps were 4 to       5 mm in size. These polyps were removed with a cold snare. Resection and       retrieval were complete.      The exam was otherwise without abnormality. Impression:            - Two 4 to 5 mm polyps in the sigmoid colon, removed                         with a cold snare. Resected and retrieved.                        - The examination was otherwise normal. Recommendation:        - Discharge patient to home (with escort).                        - Resume previous diet.                        - Continue present medications.                        - Await pathology results.                        - Repeat colonoscopy for surveillance based on                         pathology results. Procedure Code(s):     --- Professional ---                        [phone], Colonoscopy, flexible; with removal of                         tumor(s), polyp(s), or other lesion(s) by snare                         technique Diagnosis Code(s):     --- Professional ---  Z12.11, Encounter for screening for malignant neoplasm                         of colon                        D12.5, Benign neoplasm of sigmoid colon CPT copyright 2022 American Medical Association. All rights reserved. The codes documented in this report are preliminary and upon coder review may  be revised to meet current compliance requirements. Wyline Mood, MD Wyline Mood MD, MD 04/29/2023 10:43:19 AM This report has been signed electronically. Number of Addenda: 0 Note Initiated On: 04/29/2023 10:22 AM Scope Withdrawal Time: 0 hours 8 minutes 23 seconds  Total Procedure  Duration: 0 hours 10 minutes 22 seconds  Estimated Blood Loss:  Estimated blood loss: none.      [facility]

## 2023-04-30 ENCOUNTER — Encounter: Payer: Self-pay | Admitting: Gastroenterology

## 2023-05-04 ENCOUNTER — Encounter: Payer: Self-pay | Admitting: Gastroenterology

## 2023-05-10 ENCOUNTER — Other Ambulatory Visit: Payer: Self-pay | Admitting: Family Medicine

## 2023-05-10 DIAGNOSIS — F5101 Primary insomnia: Secondary | ICD-10-CM

## 2023-05-10 DIAGNOSIS — F419 Anxiety disorder, unspecified: Secondary | ICD-10-CM

## 2023-08-29 IMAGING — CT CT CHEST LUNG CANCER SCREENING LOW DOSE W/O CM
2 of 5 series · 15 of 40 positions shown, 18 images · non-contrast
Comparison: 12/04/2020

CLINICAL DATA: 60-year-old female with 45 pack-year history of
smoking. Lung cancer screening.



[Series 3: lung 1.00 · axial · 0.66mm/px · z∈[-1233,-891]mm · 12 of 378 slices shown, 15 images]
[im 18/378  mediastinal]
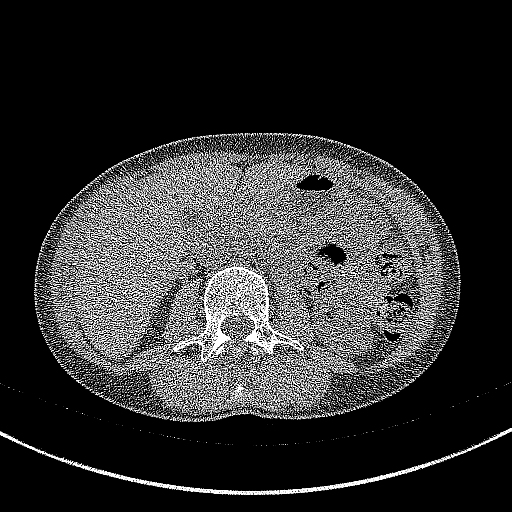
[im 18/378  lung]
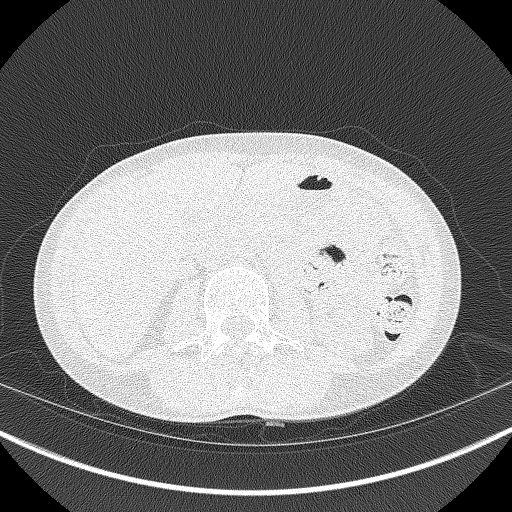
[im 52/378  lung]
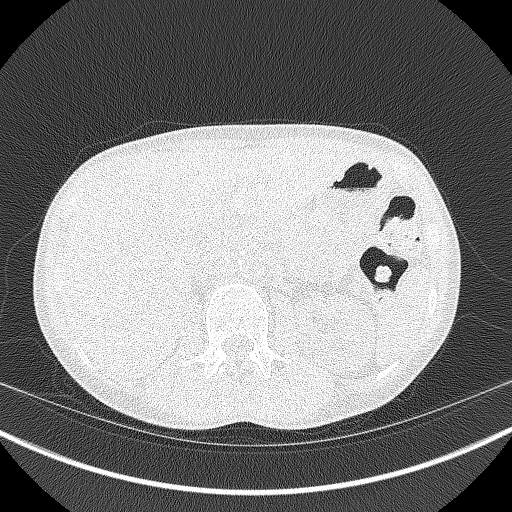
[im 86/378  lung]
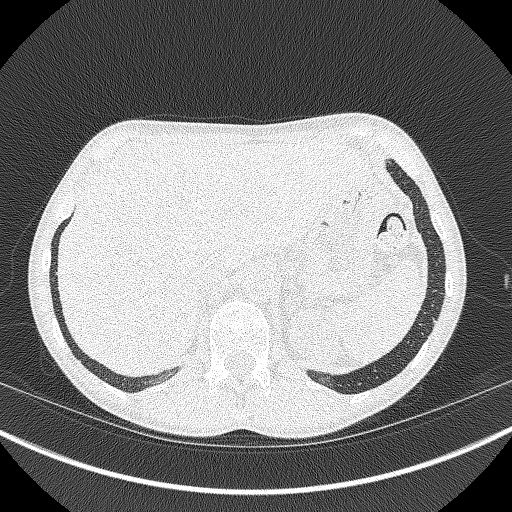
[im 120/378  lung]
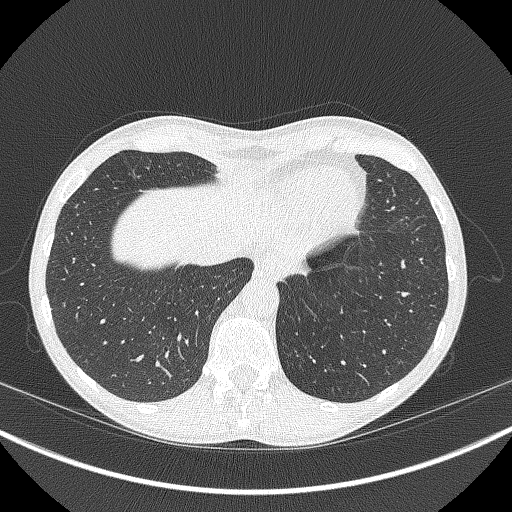
[im 138/378  mediastinal]
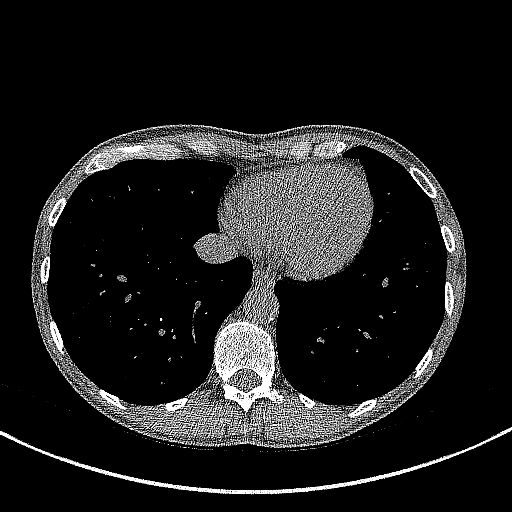
[im 138/378  lung]
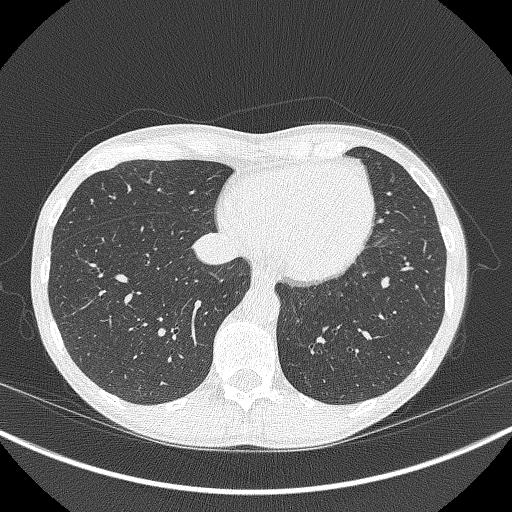
[im 172/378  lung]
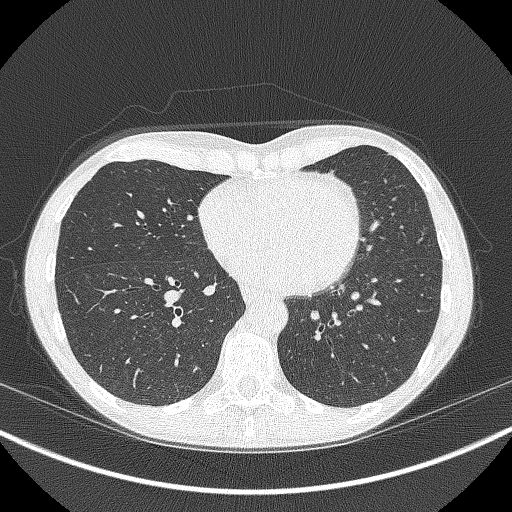
[im 206/378  lung]
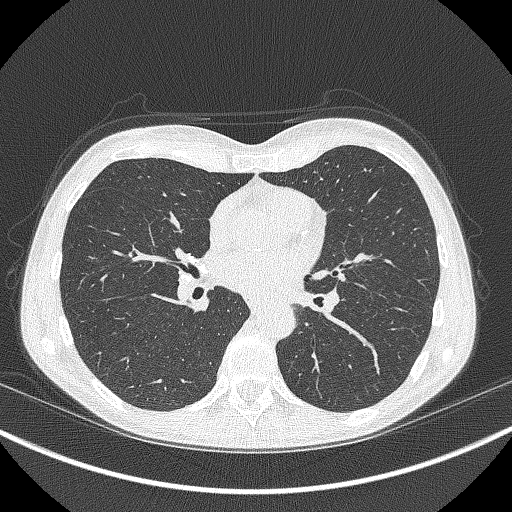
[im 240/378  lung]
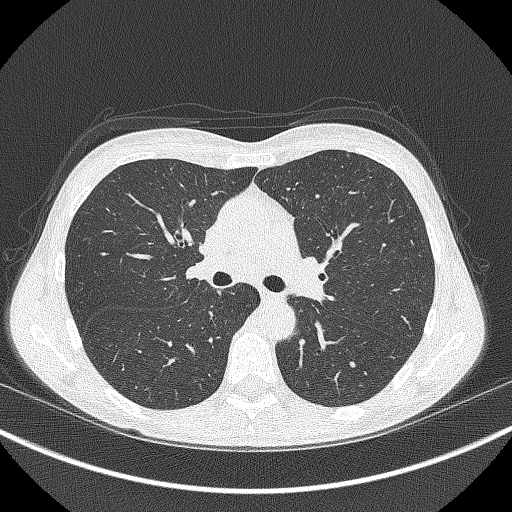
[im 258/378  mediastinal]
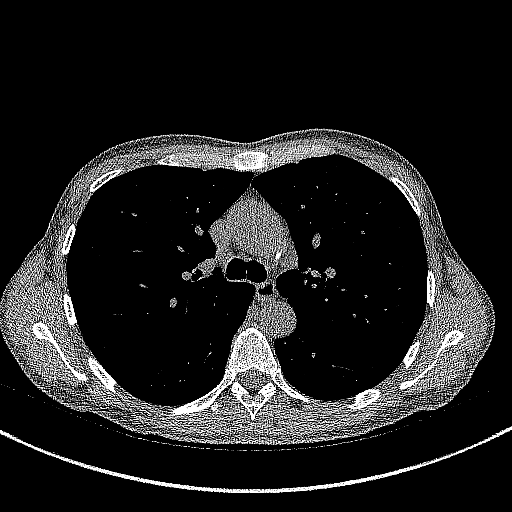
[im 258/378  lung]
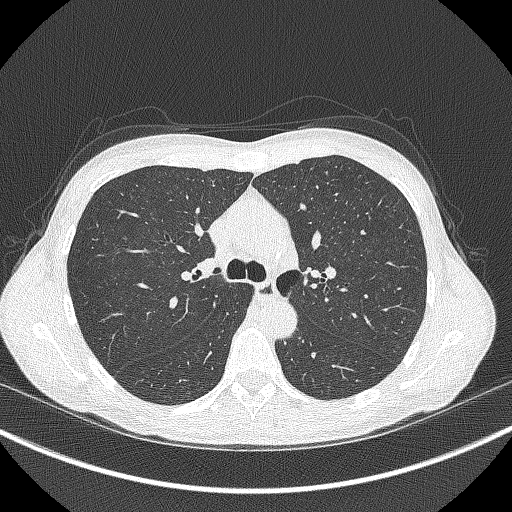
[im 292/378  lung]
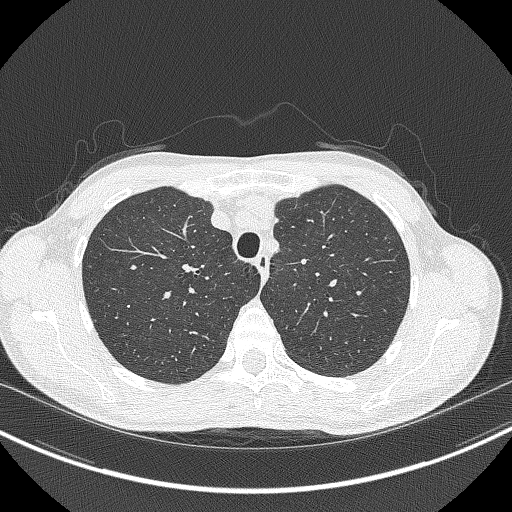
[im 326/378  lung]
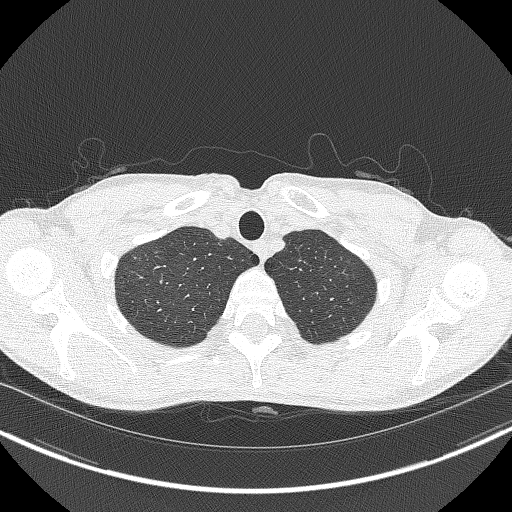
[im 360/378  lung]
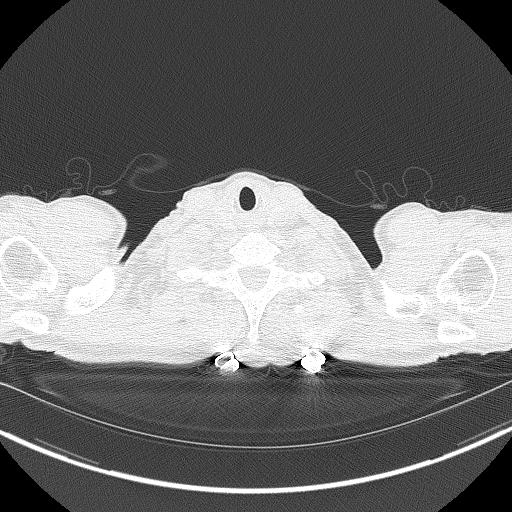

[Series 5: coronals lung 1.00 cor · coronal · 0.66mm/px · 3 of 289 slices shown]
[im 58/289  lung]
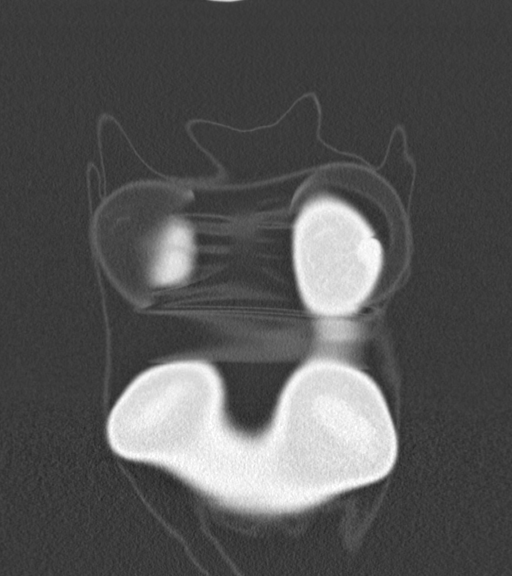
[im 116/289  lung]
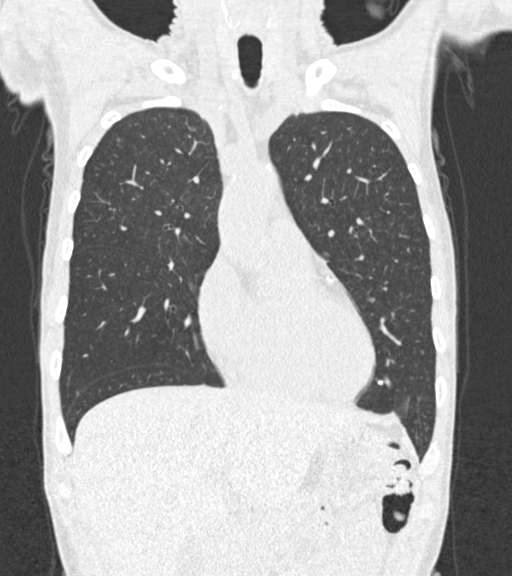
[im 173/289  lung]
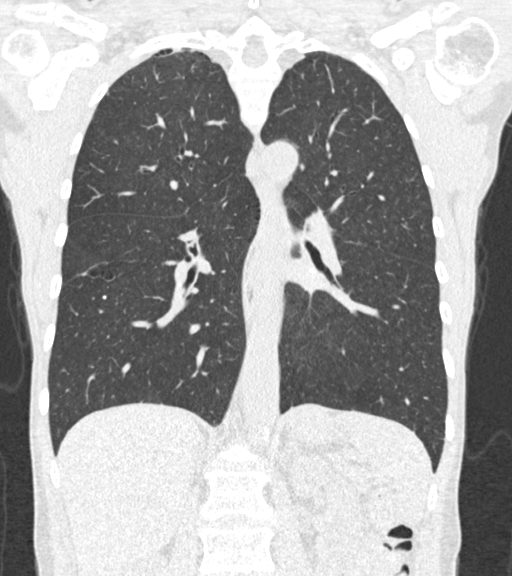

[15 of 40 positions shown; findings below may reference images not displayed]

FINDINGS: Cardiovascular: The heart size is normal. No substantial pericardial
effusion. Coronary artery calcification is evident. Mild
atherosclerotic calcification is noted in the wall of the thoracic
aorta.

Mediastinum/Nodes: No mediastinal lymphadenopathy. No evidence for
gross hilar lymphadenopathy although assessment is limited by the
lack of intravenous contrast on the current study. Calcified nodal
tissue is seen in the hilar regions bilaterally compatible with old
granulomatous disease. The esophagus has normal imaging features.
There is no axillary lymphadenopathy.

Lungs/Pleura: Centrilobular and paraseptal emphysema evident.
Scattered calcified granulomata are noted in both lungs. Additional
tiny noncalcified nodules are evident bilaterally measuring up to
1.8 mm. No suspicious pulmonary nodule or mass on today's study. No
focal airspace consolidation. No pleural effusion.

Upper Abdomen: Unremarkable.

Musculoskeletal: No worrisome lytic or sclerotic osseous
abnormality.
IMPRESSION: 1. Lung-RADS 2, benign appearance or behavior. Continue annual
screening with low-dose chest CT without contrast in 12 months.
2.  Emphysema (UXM1P-7IX.D) and Aortic Atherosclerosis (UXM1P-170.0)

## 2023-09-27 ENCOUNTER — Other Ambulatory Visit: Payer: Self-pay | Admitting: Family Medicine

## 2023-09-27 DIAGNOSIS — F419 Anxiety disorder, unspecified: Secondary | ICD-10-CM

## 2023-09-27 DIAGNOSIS — F5101 Primary insomnia: Secondary | ICD-10-CM

## 2023-09-27 DIAGNOSIS — G2581 Restless legs syndrome: Secondary | ICD-10-CM

## 2023-12-28 ENCOUNTER — Other Ambulatory Visit: Payer: Self-pay | Admitting: Family Medicine

## 2023-12-28 DIAGNOSIS — F419 Anxiety disorder, unspecified: Secondary | ICD-10-CM

## 2024-01-26 ENCOUNTER — Other Ambulatory Visit: Payer: Self-pay | Admitting: Family Medicine

## 2024-01-26 DIAGNOSIS — G2581 Restless legs syndrome: Secondary | ICD-10-CM

## 2024-02-24 ENCOUNTER — Other Ambulatory Visit: Payer: Self-pay | Admitting: Family Medicine

## 2024-02-24 DIAGNOSIS — F419 Anxiety disorder, unspecified: Secondary | ICD-10-CM

## 2024-02-29 ENCOUNTER — Other Ambulatory Visit: Payer: Self-pay | Admitting: Family Medicine

## 2024-02-29 DIAGNOSIS — F419 Anxiety disorder, unspecified: Secondary | ICD-10-CM

## 2024-03-10 ENCOUNTER — Other Ambulatory Visit: Payer: Self-pay | Admitting: Family Medicine

## 2024-03-10 ENCOUNTER — Other Ambulatory Visit: Payer: Self-pay

## 2024-03-10 DIAGNOSIS — Z87891 Personal history of nicotine dependence: Secondary | ICD-10-CM

## 2024-03-10 DIAGNOSIS — F17210 Nicotine dependence, cigarettes, uncomplicated: Secondary | ICD-10-CM

## 2024-03-10 DIAGNOSIS — Z122 Encounter for screening for malignant neoplasm of respiratory organs: Secondary | ICD-10-CM

## 2024-03-25 ENCOUNTER — Other Ambulatory Visit: Payer: Self-pay | Admitting: Family Medicine

## 2024-03-25 DIAGNOSIS — E7800 Pure hypercholesterolemia, unspecified: Secondary | ICD-10-CM

## 2024-03-27 ENCOUNTER — Other Ambulatory Visit: Payer: Self-pay | Admitting: Family Medicine

## 2024-03-27 DIAGNOSIS — F5101 Primary insomnia: Secondary | ICD-10-CM

## 2024-03-27 DIAGNOSIS — F419 Anxiety disorder, unspecified: Secondary | ICD-10-CM

## 2024-04-30 ENCOUNTER — Telehealth: Payer: Self-pay | Admitting: Family Medicine

## 2024-04-30 DIAGNOSIS — F5101 Primary insomnia: Secondary | ICD-10-CM

## 2024-04-30 DIAGNOSIS — F419 Anxiety disorder, unspecified: Secondary | ICD-10-CM

## 2024-05-02 NOTE — Telephone Encounter (Signed)
 This patient has not  been since in over a year and needs to schedule appointment before refill can be approved.

## 2024-05-03 ENCOUNTER — Other Ambulatory Visit: Payer: Self-pay | Admitting: Family Medicine

## 2024-05-03 DIAGNOSIS — G2581 Restless legs syndrome: Secondary | ICD-10-CM

## 2024-05-03 DIAGNOSIS — E7800 Pure hypercholesterolemia, unspecified: Secondary | ICD-10-CM

## 2024-05-08 ENCOUNTER — Other Ambulatory Visit: Payer: Self-pay | Admitting: Family Medicine

## 2024-05-08 DIAGNOSIS — G2581 Restless legs syndrome: Secondary | ICD-10-CM

## 2024-05-08 NOTE — Telephone Encounter (Unsigned)
 Copied from CRM #[id number]. Topic: Clinical - Medication Refill >> May 08, 2024  2:15 PM Everette C wrote: Medication: clonazePAM  (KLONOPIN ) 0.5 MG tablet   Has the patient contacted their pharmacy? Yes (Agent: If no, request that the patient contact the pharmacy for the refill. If patient does not wish to contact the pharmacy document the reason why and proceed with request.) (Agent: If yes, when and what did the pharmacy advise?)  This is the patient's preferred pharmacy:  [id number] [address], [place] - [address] AT [facility] OF [address] [place] [address] Phone: [phone] Fax: [phone]  Is this the correct pharmacy for this prescription? Yes If no, delete pharmacy and type the correct one.   Has the prescription been filled recently? Yes  Is the patient out of the medication? Yes  Has the patient been seen for an appointment in the last year OR does the patient have an upcoming appointment? Yes  Can we respond through MyChart? No  Agent: Please be advised that Rx refills may take up to 3 business days. We ask that you follow-up with your pharmacy.

## 2024-05-10 NOTE — Telephone Encounter (Unsigned)
 Copied from CRM #[id number]. Topic: Clinical - Prescription Issue >> May 10, 2024  1:31 PM Roselie BROCKS wrote: Reason for CRM: [facility] is needing clarification on 2 medcations,  ALPRAZolam  (XANAX ) 0.5 MG tablet clonazePAM  (KLONOPIN ) 0.5 MG tablet  [facility] pharmacy phone number [phone]

## 2024-05-10 NOTE — Telephone Encounter (Signed)
 Unable to refill per protocol, courtesy refill already given, OV needed.  Requested Prescriptions  Pending Prescriptions Disp Refills   clonazePAM  (KLONOPIN ) 0.5 MG tablet 30 tablet 2     Not Delegated - Psychiatry: Anxiolytics/Hypnotics 2 Failed - 05/10/2024 12:33 PM      Failed - This refill cannot be delegated      Failed - Urine Drug Screen completed in last 360 days      Failed - Valid encounter within last 6 months    Recent Outpatient Visits   None            Passed - Patient is not pregnant

## 2024-05-29 ENCOUNTER — Other Ambulatory Visit: Payer: Self-pay | Admitting: Family Medicine

## 2024-05-29 DIAGNOSIS — G2581 Restless legs syndrome: Secondary | ICD-10-CM

## 2024-06-23 ENCOUNTER — Other Ambulatory Visit: Payer: Self-pay | Admitting: Family Medicine

## 2024-06-23 DIAGNOSIS — F419 Anxiety disorder, unspecified: Secondary | ICD-10-CM

## 2024-06-23 DIAGNOSIS — E7800 Pure hypercholesterolemia, unspecified: Secondary | ICD-10-CM

## 2024-06-26 ENCOUNTER — Telehealth: Payer: Self-pay | Admitting: Family Medicine

## 2024-06-26 NOTE — Telephone Encounter (Addendum)
 [facility] faxed refill request for the following medications:  ALPRAZolam  (XANAX ) 0.5 MG tablet   lovastatin (MEVACOR) 20 MG tablet    Please advise.

## 2024-06-26 NOTE — Telephone Encounter (Signed)
 Requested medication (s) are due for refill today: yes  Requested medication (s) are on the active medication list: yes  Last refill:  02/25/24  Future visit scheduled: no  Notes to clinic:  Unable to refill per protocol, cannot delegate.      Requested Prescriptions  Pending Prescriptions Disp Refills   ALPRAZolam  (XANAX ) 0.5 MG tablet [Pharmacy Med Name: ALPRAZOLAM  0.5MG  TABLETS] 60 tablet     Sig: TAKE 1 TABLET(0.5 MG) BY MOUTH TWICE DAILY AS NEEDED FOR ANXIETY     Not Delegated - Psychiatry: Anxiolytics/Hypnotics 2 Failed - 06/26/2024 12:29 PM      Failed - This refill cannot be delegated      Failed - Urine Drug Screen completed in last 360 days      Failed - Valid encounter within last 6 months    Recent Outpatient Visits   None            Passed - Patient is not pregnant       lovastatin (MEVACOR) 20 MG tablet [Pharmacy Med Name: LOVASTATIN 20MG  TABLETS] 90 tablet 0    Sig: TAKE 1 TABLET(20 MG) BY MOUTH DAILY     Cardiovascular:  Antilipid - Statins 2 Failed - 06/26/2024 12:29 PM      Failed - Cr in normal range and within 360 days    Creatinine  Date Value Ref Range Status  11/07/2011 0.69 0.60 - 1.30 mg/dL Final   Creatinine, Ser  Date Value Ref Range Status  03/16/2023 0.92 0.57 - 1.00 mg/dL Final         Failed - Valid encounter within last 12 months    Recent Outpatient Visits   None            Failed - Lipid Panel in normal range within the last 12 months    Cholesterol, Total  Date Value Ref Range Status  03/16/2023 183 100 - 199 mg/dL Final   LDL Chol Calc (NIH)  Date Value Ref Range Status  03/16/2023 97 0 - 99 mg/dL Final   HDL  Date Value Ref Range Status  03/16/2023 70 >39 mg/dL Final   Triglycerides  Date Value Ref Range Status  03/16/2023 91 0 - 149 mg/dL Final         Passed - Patient is not pregnant

## 2024-06-27 ENCOUNTER — Other Ambulatory Visit: Payer: Self-pay

## 2024-06-27 DIAGNOSIS — F419 Anxiety disorder, unspecified: Secondary | ICD-10-CM

## 2024-06-27 DIAGNOSIS — E7800 Pure hypercholesterolemia, unspecified: Secondary | ICD-10-CM

## 2024-06-27 NOTE — Telephone Encounter (Signed)
 The patient called in to check on the status for the refills of both these medications stating she will be out by tomorrow. She said she had issues last time getting refills and she was without her medications for quite some time. Please assist patient further and let her know when these refills have been called in. She says she is sure hoping it is today because she needs them.

## 2024-06-28 ENCOUNTER — Other Ambulatory Visit: Payer: Self-pay | Admitting: Family Medicine

## 2024-06-28 DIAGNOSIS — G2581 Restless legs syndrome: Secondary | ICD-10-CM

## 2024-06-28 DIAGNOSIS — E7800 Pure hypercholesterolemia, unspecified: Secondary | ICD-10-CM

## 2024-07-21 ENCOUNTER — Ambulatory Visit: Admitting: Family Medicine

## 2024-07-21 ENCOUNTER — Encounter: Payer: Self-pay | Admitting: Family Medicine

## 2024-07-21 VITALS — BP 143/101 | HR 69 | Temp 97.6°F | Resp 16 | Ht 64.0 in | Wt 126.7 lb

## 2024-07-21 DIAGNOSIS — E7800 Pure hypercholesterolemia, unspecified: Secondary | ICD-10-CM | POA: Diagnosis not present

## 2024-07-21 DIAGNOSIS — I2510 Atherosclerotic heart disease of native coronary artery without angina pectoris: Secondary | ICD-10-CM | POA: Diagnosis not present

## 2024-07-21 DIAGNOSIS — G2581 Restless legs syndrome: Secondary | ICD-10-CM | POA: Diagnosis not present

## 2024-07-21 DIAGNOSIS — J329 Chronic sinusitis, unspecified: Secondary | ICD-10-CM | POA: Diagnosis not present

## 2024-07-21 DIAGNOSIS — F419 Anxiety disorder, unspecified: Secondary | ICD-10-CM

## 2024-07-21 MED ORDER — AMOXICILLIN 500 MG PO CAPS: 1000.0000 mg | ORAL_CAPSULE | Freq: Two times a day (BID) | ORAL | 0 refills | Status: AC

## 2024-07-21 NOTE — Progress Notes (Signed)
 Established patient visit   Patient: Kristen Wright   DOB: [birth year]   63 y.o. Female  MRN: [id number] Visit Date: 07/21/2024  Today's healthcare provider: Nancyann Perry, MD   Chief Complaint  Patient presents with   URI    Patient reports having flu symptoms for the past 2 weeks. Patient having cough, sneezing, headaches off and on, nasal congestion. OTC meds Dayquil and Nyquil   Medication Refill    Needs Lovastatin   Subjective    Discussed the use of AI scribe software for clinical note transcription with the patient, who gave verbal consent to proceed.  History of Present Illness   Kristen Wright is a 63 year old female who presents with symptoms of a sinus infection and difficulty breathing through her nose.  She has been experiencing symptoms for nearly two weeks, including headaches, difficulty swallowing, and a sensation of weakness and faintness. There is a feeling of not getting enough air when breathing through her nose, despite being able to breathe in. She also notes congestion around her eyes and difficulty hearing, as evidenced by [employer].  No ear pain is present, but there is difficulty hearing, affecting her ability to understand conversations. She has not measured her temperature but has continued working. She feels shaky, and symptoms seem to improve slightly before worsening again. No throat pain is present, but there is difficulty swallowing.  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     She is also due for follow up hyperlipidemia, RLS and anxiety disorder. She is taking lovastatin consistently. She also continues clonazepam  which she takes nightly and alprazolam  which she takes once or twice a day as needed. Reports medications remain effective.   Medications: Outpatient Medications Prior to Visit  Medication Sig   ALPRAZolam  (XANAX ) 0.5 MG tablet TAKE 1 TABLET(0.5  MG) BY MOUTH TWICE DAILY AS NEEDED FOR ANXIETY   clonazePAM  (KLONOPIN ) 0.5 MG tablet TAKE 1 TABLET BY MOUTH AT BEDTIME   lovastatin (MEVACOR) 20 MG tablet TAKE 1 TABLET(20 MG) BY MOUTH DAILY   doxepin  (SINEQUAN ) 25 MG capsule TAKE 1 CAPSULE(25 MG) BY MOUTH AT BEDTIME   No facility-administered medications prior to visit.   Review of Systems  Constitutional:  Negative for appetite change, chills, fatigue and fever.  Respiratory:  Negative for chest tightness and shortness of breath.   Cardiovascular:  Negative for chest pain and palpitations.  Gastrointestinal:  Negative for abdominal pain, nausea and vomiting.  Neurological:  Negative for dizziness and weakness.       Objective    BP (!) 143/101 (BP Location: Left Arm, Patient Position: Sitting, Cuff Size: Normal)   Pulse 69   Temp 97.6 F (36.4 C) (Oral)   Resp 16   Ht 5' 4 (1.626 m)   Wt 126 lb 11.2 oz (57.5 kg)   SpO2 100%   BMI 21.75 kg/m   Physical Exam   General Appearance:    Well developed, well nourished female, alert, cooperative, in no acute distress  HENT:   bilateral TM normal without fluid or infection, neck without nodes, throat normal without erythema or exudate, frontal sinus tender, and nasal mucosa congested  Eyes:    PERRL, conjunctiva/corneas clear, EOM's intact       Lungs:     Clear to auscultation bilaterally, respirations unlabored  Heart:    Normal heart rate. Normal rhythm. No murmurs, rubs, or  gallops.    Neurologic:   Awake, alert, oriented x 3. No apparent focal neurological           defect.         Assessment & Plan    1. Sinusitis, unspecified chronicity, unspecified location (Primary) Amoxicillin  500mg , two twice a day for 7 days.  Call if symptoms change or if not rapidly improving.    2. Pure hypercholesterolemia She is tolerating lovastatin well with no adverse effects.    - CBC - Comprehensive metabolic panel with GFR - Lipid panel  3. Atherosclerosis of coronary artery of  native heart without angina pectoris, unspecified vessel or lesion type Asymptomatic. Compliant with medication.  Continue aggressive risk factor modification.   - CBC - Comprehensive metabolic panel with GFR - Lipid panel  4. Restless leg syndrome Continue hs clonazepam  which remains effective.   5. Anxiety Doing well prn alprazolam       Nancyann Perry, MD  [facility] [phone] (phone) [phone] (fax)  [facility]

## 2024-07-21 NOTE — Patient Instructions (Signed)
 SABRA  Please review the attached list of medications and notify my office if there are any errors.   . Please bring all of your medications to every appointment so we can make sure that our medication list is the same as yours.

## 2024-07-22 LAB — LIPID PANEL
Chol/HDL Ratio: 2.8 ratio (ref 0.0–4.4)
Cholesterol, Total: 177 mg/dL (ref 100–199)
HDL: 63 mg/dL (ref >39)
LDL Chol Calc (NIH): 101 mg/dL — ABNORMAL HIGH (ref 0–99)
Triglycerides: 67 mg/dL (ref 0–149)
VLDL Cholesterol Cal: 13 mg/dL (ref 5–40)

## 2024-07-22 LAB — COMPREHENSIVE METABOLIC PANEL WITH GFR
ALT: 13 IU/L (ref 0–32)
AST: 16 IU/L (ref 0–40)
Albumin: 4.7 g/dL (ref 3.9–4.9)
Alkaline Phosphatase: 85 IU/L (ref 49–135)
BUN/Creatinine Ratio: 13 (ref 12–28)
BUN: 12 mg/dL (ref 8–27)
Bilirubin Total: 0.3 mg/dL (ref 0.0–1.2)
CO2: 25 mmol/L (ref 20–29)
Calcium: 10 mg/dL (ref 8.7–10.3)
Chloride: 104 mmol/L (ref 96–106)
Creatinine, Ser: 0.94 mg/dL (ref 0.57–1.00)
Globulin, Total: 2.3 g/dL (ref 1.5–4.5)
Glucose: 89 mg/dL (ref 70–99)
Potassium: 5.5 mmol/L — ABNORMAL HIGH (ref 3.5–5.2)
Sodium: 141 mmol/L (ref 134–144)
Total Protein: 7 g/dL (ref 6.0–8.5)
eGFR: 69 mL/min/1.73 (ref >59)

## 2024-07-22 LAB — CBC
Hematocrit: 43.5 % (ref 34.0–46.6)
Hemoglobin: 14 g/dL (ref 11.1–15.9)
MCH: 30.4 pg (ref 26.6–33.0)
MCHC: 32.2 g/dL (ref 31.5–35.7)
MCV: 94 fL (ref 79–97)
Platelets: 304 x10E3/uL (ref 150–450)
RBC: 4.61 x10E6/uL (ref 3.77–5.28)
RDW: 12.6 % (ref 11.7–15.4)
WBC: 8.2 x10E3/uL (ref 3.4–10.8)

## 2024-07-23 ENCOUNTER — Ambulatory Visit: Payer: Self-pay | Admitting: Family Medicine

## 2024-07-23 DIAGNOSIS — E7800 Pure hypercholesterolemia, unspecified: Secondary | ICD-10-CM

## 2024-07-23 MED ORDER — LOVASTATIN 20 MG PO TABS: 20.0000 mg | ORAL_TABLET | Freq: Every day | ORAL | 2 refills | Status: DC

## 2024-08-08 ENCOUNTER — Other Ambulatory Visit: Payer: Self-pay | Admitting: Family Medicine

## 2024-08-08 DIAGNOSIS — F419 Anxiety disorder, unspecified: Secondary | ICD-10-CM

## 2024-08-19 ENCOUNTER — Other Ambulatory Visit: Payer: Self-pay | Admitting: Family Medicine

## 2024-08-19 DIAGNOSIS — G2581 Restless legs syndrome: Secondary | ICD-10-CM

## 2024-08-19 DIAGNOSIS — E7800 Pure hypercholesterolemia, unspecified: Secondary | ICD-10-CM

## 2024-08-28 ENCOUNTER — Telehealth: Payer: Self-pay

## 2024-08-28 DIAGNOSIS — F419 Anxiety disorder, unspecified: Secondary | ICD-10-CM

## 2024-08-28 MED ORDER — ALPRAZOLAM 0.5 MG PO TABS: 0.5000 mg | ORAL_TABLET | Freq: Two times a day (BID) | ORAL | 3 refills | Status: DC | PRN

## 2024-08-28 NOTE — Telephone Encounter (Signed)
 Copied from CRM [id number]. Topic: Clinical - Medication Question >> Aug 25, 2024  3:42 PM Hadassah PARAS wrote: Reason for CRM: Pt is requesting for ALPRAZolam  (XANAX ) 0.5 MG tablet to be prescribed for 30 days instead of having to go twice a moth, every 15 days. Pt works for the [employer] amd gets off work late. Please advise pt on #[phone]

## 2024-10-13 ENCOUNTER — Other Ambulatory Visit: Payer: Self-pay | Admitting: Family Medicine

## 2024-10-13 DIAGNOSIS — F419 Anxiety disorder, unspecified: Secondary | ICD-10-CM

## 2024-10-13 NOTE — Telephone Encounter (Signed)
 Copied from CRM [id number]. Topic: Clinical - Medication Refill >> Oct 13, 2024  3:12 PM Rosaria BRAVO wrote: Says she contacted her pharmacy two days ago requesting this refill. Says she wants this prescription for 30 days instead of 15.  Medication:  ALPRAZolam  (XANAX ) 0.5 MG tablet   Has the patient contacted their pharmacy? Yes (Agent: If no, request that the patient contact the pharmacy for the refill. If patient does not wish to contact the pharmacy document the reason why and proceed with request.) (Agent: If yes, when and what did the pharmacy advise?)  This is the patient's preferred pharmacy:  [id number] [address],  - [address] AT [facility] OF [address] [place] [address] Phone: [phone] Fax: [phone]  Is this the correct pharmacy for this prescription? Yes If no, delete pharmacy and type the correct one.   Has the prescription been filled recently? Yes  Is the patient out of the medication? Yes, took her last pill two nights ago.   Has the patient been seen for an appointment in the last year OR does the patient have an upcoming appointment? Yes  Can we respond through MyChart? Yes  Agent: Please be advised that Rx refills may take up to 3 business days. We ask that you follow-up with your pharmacy.

## 2024-10-15 MED ORDER — ALPRAZOLAM 0.5 MG PO TABS: 0.5000 mg | ORAL_TABLET | Freq: Two times a day (BID) | ORAL | 3 refills | Status: AC | PRN
